# Patient Record
Sex: Female | Born: 1955 | Race: White | Hispanic: No | Smoking: Never smoker
Health system: Southern US, Community
[De-identification: ages and names within clinical notes are randomized; demographics above are authoritative.]

## PROBLEM LIST (undated history)

## (undated) DIAGNOSIS — I10 Essential (primary) hypertension: Secondary | ICD-10-CM

## (undated) DIAGNOSIS — E785 Hyperlipidemia, unspecified: Secondary | ICD-10-CM

## (undated) DIAGNOSIS — E079 Disorder of thyroid, unspecified: Secondary | ICD-10-CM

## (undated) HISTORY — DX: Disorder of thyroid, unspecified: E07.9

## (undated) HISTORY — DX: Hyperlipidemia, unspecified: E78.5

## (undated) HISTORY — DX: Essential (primary) hypertension: I10

---

## 1994-10-20 HISTORY — PX: CHOLECYSTECTOMY: SHX55

## 2012-01-29 ENCOUNTER — Other Ambulatory Visit (HOSPITAL_COMMUNITY)
Admission: RE | Admit: 2012-01-29 | Discharge: 2012-01-29 | Disposition: A | Discharge status: Home / Self Care | Payer: BC Managed Care – PPO | Source: Ambulatory Visit | Attending: Obstetrics and Gynecology | Admitting: Obstetrics and Gynecology

## 2012-01-29 DIAGNOSIS — Z124 Encounter for screening for malignant neoplasm of cervix: Secondary | ICD-10-CM | POA: Insufficient documentation

## 2014-08-07 LAB — HM MAMMOGRAPHY

## 2014-08-20 HISTORY — PX: EYELID LACERATION REPAIR: SHX1564

## 2014-09-19 ENCOUNTER — Encounter: Payer: Self-pay | Admitting: Internal Medicine

## 2014-09-19 ENCOUNTER — Encounter: Payer: Self-pay | Admitting: *Deleted

## 2014-09-19 ENCOUNTER — Ambulatory Visit (INDEPENDENT_AMBULATORY_CARE_PROVIDER_SITE_OTHER): Payer: PRIVATE HEALTH INSURANCE | Admitting: Internal Medicine

## 2014-09-19 VITALS — BP 130/88 | HR 70 | Temp 98.0°F | Resp 10 | Ht 64.5 in | Wt 232.0 lb

## 2014-09-19 DIAGNOSIS — G4700 Insomnia, unspecified: Secondary | ICD-10-CM

## 2014-09-19 DIAGNOSIS — E890 Postprocedural hypothyroidism: Secondary | ICD-10-CM

## 2014-09-19 DIAGNOSIS — E785 Hyperlipidemia, unspecified: Secondary | ICD-10-CM | POA: Insufficient documentation

## 2014-09-19 DIAGNOSIS — E669 Obesity, unspecified: Secondary | ICD-10-CM

## 2014-09-19 NOTE — Progress Notes (Signed)
Patient ID: Kristen Moran, female   DOB: September 15, 1956, 58 y.o.   MRN: [id number]    Chief Complaint  Patient presents with  . Establish Care    New patient establish care, discuss diagnosis of high blood pressure, not on medications x 5 years   . Medical Management of Chronic Issues    Discuss starting Adderall    No Known Allergies  HPI 58 y/o female patient is here to establish care. She sees dr patel for endocrinology at [facility] She was seeing Dr Donata DuffPollick 5 years back as PCP She last saw her ObGyn 3 years back uptodate with mammogram in oct 2015 was normal Last pap smear 3 years back She mentions having rouble concentrating and completing her task for several years. Denies any anxiety or depression  Review of Systems  Constitutional: Negative for fever, chills, diaphoresis. low energy level HENT: Negative for congestion, hearing loss and sore throat.   Eyes: Negative for blurred vision, double vision and discharge. uptodate with eye exam Respiratory: Negative for cough, sputum production, shortness of breath and wheezing.   Cardiovascular: Negative for chest pain, palpitations, orthopnea and leg swelling.  Gastrointestinal: Negative for heartburn, nausea, vomiting, abdominal pain, diarrhea and constipation.  Genitourinary: Negative for dysuria, urgency, frequency and flank pain.  Musculoskeletal: Negative for back pain, falls, myalgias. has knee and ankle joint pain Skin: Negative for itching and rash.  Neurological: egative for dizziness, tingling, focal weakness and headaches.  Psychiatric/Behavioral: Negative for depression and memory loss. Has some anxiety at new work. [place] on business trip this week. Has trouble sleeping at night. Denies nocturia  Past Medical History  Diagnosis Date  . Hypertension   . Hyperlipidemia   . Thyroid disease     Hyperthyroidism   Past Surgical History  Procedure Laterality Date  . Cesarean section  Willy Eddy1990   Faraban MD  . Cesarean section  1993    Faraban MD  . Cholecystectomy  [id number]    Clent RidgesWalsh, MD  . Eyelid laceration repair  08/2014     Medication List       This list is accurate as of: 09/19/14 11:42 AM.  Always use your most recent med list.               cholecalciferol 1000 UNITS tablet  Commonly known as:  VITAMIN D  Take 1,000 Units by mouth daily.     FISH OIL BURP-LESS 1000 MG Caps  Take 1 capsule by mouth daily.     levothyroxine 150 MCG tablet  Commonly known as:  SYNTHROID, LEVOTHROID  1 by mouth daily x 6 days of the week, 1 1/2 on the 7th day for hypothyroidism     multivitamin with minerals tablet  Take 1 tablet by mouth daily.     neomycin-polymyxin b-dexamethasone 3.5-10000-0.1 Oint  Commonly known as:  MAXITROL  Apply 1 strip twice daily on the suture line of both eyelids     vitamin B-12 100 MCG tablet  Commonly known as:  CYANOCOBALAMIN  Take 100 mcg by mouth daily.       Family History  Problem Relation Age of Onset  . Cancer Mother     Breast  . Colitis Mother   . Osteoporosis Mother   . Diabetes Father     Type 2  . Cancer Maternal Grandmother     60  . Cancer Maternal Grandfather     1170   History   Social History  . Marital  Status: Married    Spouse Name: N/A    Number of Children: N/A  . Years of Education: N/A   Social History Main Topics  . Smoking status: Never Smoker   . Smokeless tobacco: Never Used  . Alcohol Use: 0.0 oz/week    0 Not specified per week     Comment: 1-2  . Drug Use: No  . Sexual Activity: None   Other Topics Concern  . None   Social History Narrative   Physical exam BP 130/88 mmHg  Pulse 70  Temp(Src) 98 F (36.7 C) (Oral)  Resp 10  Ht 5' 4.5" (1.638 m)  Wt 232 lb (105.235 kg)  BMI 39.22 kg/m2  SpO2 98%  General- adult female in no acute distress, obese Head- atraumatic, normocephalic Eyes- PERRLA, EOMI, no pallor, no icterus, no discharge Neck- no lymphadenopathy Mouth- normal mucus  membrane Cardiovascular- normal s1,s2, no murmurs, normal distal pulses Respiratory- bilateral clear to auscultation, no wheeze, no rhonchi, no crackles Abdomen- bowel sounds present, soft, non tender Musculoskeletal- able to move all 4 extremities, steady gait, no use of assistive device, normal range of motion, no leg edema Neurological- no focal deficit Skin- warm and dry Psychiatry- alert and oriented to person, place and time, normal mood and affect  Labs- none to review  Assessment/plan  1. Postoperative hypothyroidism continue levothyroxine, check thyroid panel - CMP - Lipid Panel - CBC with Differential - TSH  2. Hyperlipidemia Check lipid panel, currently off all meds - CMP - Lipid Panel  3. Obesity counselling on diet and exercise. Assess for metabolic syndrome - CMP - CBC with Differential - Hemoglobin A1c  4. Insomnia Advised on sleep hygiene and to take melatonin 3 mg qhs for now  Advised on influenza vaccine and pt would like to take it after her trip

## 2014-09-20 LAB — LIPID PANEL
CHOL/HDL RATIO: 2.3 ratio (ref 0.0–4.4)
Cholesterol, Total: 203 mg/dL — ABNORMAL HIGH (ref 100–199)
HDL: 88 mg/dL (ref >39)
LDL CALC: 93 mg/dL (ref 0–99)
TRIGLYCERIDES: 109 mg/dL (ref 0–149)
VLDL Cholesterol Cal: 22 mg/dL (ref 5–40)

## 2014-09-20 LAB — CBC WITH DIFFERENTIAL/PLATELET
Basophils Absolute: 0 10*3/uL (ref 0.0–0.2)
Basos: 1 %
EOS: 1 %
Eosinophils Absolute: 0.1 10*3/uL (ref 0.0–0.4)
HEMATOCRIT: 37.7 % (ref 34.0–46.6)
HEMOGLOBIN: 12.3 g/dL (ref 11.1–15.9)
Immature Grans (Abs): 0 10*3/uL (ref 0.0–0.1)
Immature Granulocytes: 0 %
LYMPHS: 36 %
Lymphocytes Absolute: 2.3 10*3/uL (ref 0.7–3.1)
MCH: 28.9 pg (ref 26.6–33.0)
MCHC: 32.6 g/dL (ref 31.5–35.7)
MCV: 89 fL (ref 79–97)
MONOCYTES: 8 %
Monocytes Absolute: 0.5 10*3/uL (ref 0.1–0.9)
NEUTROS ABS: 3.4 10*3/uL (ref 1.4–7.0)
Neutrophils Relative %: 54 %
RBC: 4.26 x10E6/uL (ref 3.77–5.28)
RDW: 13.6 % (ref 12.3–15.4)
WBC: 6.3 10*3/uL (ref 3.4–10.8)

## 2014-09-20 LAB — COMPREHENSIVE METABOLIC PANEL
A/G RATIO: 1.9 (ref 1.1–2.5)
ALBUMIN: 4.2 g/dL (ref 3.5–5.5)
ALT: 21 IU/L (ref 0–32)
AST: 22 IU/L (ref 0–40)
Alkaline Phosphatase: 81 IU/L (ref 39–117)
BILIRUBIN TOTAL: 0.4 mg/dL (ref 0.0–1.2)
BUN/Creatinine Ratio: 19 (ref 9–23)
BUN: 17 mg/dL (ref 6–24)
CO2: 24 mmol/L (ref 18–29)
CREATININE: 0.91 mg/dL (ref 0.57–1.00)
Calcium: 9.5 mg/dL (ref 8.7–10.2)
Chloride: 101 mmol/L (ref 97–108)
GFR, EST AFRICAN AMERICAN: 81 mL/min/{1.73_m2} (ref >59)
GFR, EST NON AFRICAN AMERICAN: 70 mL/min/{1.73_m2} (ref >59)
Globulin, Total: 2.2 g/dL (ref 1.5–4.5)
Glucose: 96 mg/dL (ref 65–99)
Potassium: 4.4 mmol/L (ref 3.5–5.2)
SODIUM: 140 mmol/L (ref 134–144)
Total Protein: 6.4 g/dL (ref 6.0–8.5)

## 2014-09-20 LAB — HEMOGLOBIN A1C
Est. average glucose Bld gHb Est-mCnc: 120 mg/dL
HEMOGLOBIN A1C: 5.8 % — AB (ref 4.8–5.6)

## 2014-09-20 LAB — TSH: TSH: 2.24 u[IU]/mL (ref 0.450–4.500)

## 2014-10-26 DIAGNOSIS — Z4789 Encounter for other orthopedic aftercare: Secondary | ICD-10-CM | POA: Insufficient documentation

## 2014-11-21 ENCOUNTER — Encounter: Payer: Self-pay | Admitting: Internal Medicine

## 2014-11-21 ENCOUNTER — Ambulatory Visit (INDEPENDENT_AMBULATORY_CARE_PROVIDER_SITE_OTHER): Payer: PRIVATE HEALTH INSURANCE | Admitting: Internal Medicine

## 2014-11-21 VITALS — BP 136/80 | HR 67 | Temp 97.9°F | Resp 20 | Ht 65.0 in | Wt 226.0 lb

## 2014-11-21 DIAGNOSIS — E669 Obesity, unspecified: Secondary | ICD-10-CM

## 2014-11-21 DIAGNOSIS — R7303 Prediabetes: Secondary | ICD-10-CM

## 2014-11-21 DIAGNOSIS — E890 Postprocedural hypothyroidism: Secondary | ICD-10-CM

## 2014-11-21 DIAGNOSIS — Z01419 Encounter for gynecological examination (general) (routine) without abnormal findings: Secondary | ICD-10-CM

## 2014-11-21 DIAGNOSIS — Z0000 Encounter for general adult medical examination without abnormal findings: Secondary | ICD-10-CM

## 2014-11-21 DIAGNOSIS — G4700 Insomnia, unspecified: Secondary | ICD-10-CM

## 2014-11-21 DIAGNOSIS — E785 Hyperlipidemia, unspecified: Secondary | ICD-10-CM

## 2014-11-21 DIAGNOSIS — R7309 Other abnormal glucose: Secondary | ICD-10-CM
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
## 2014-11-25 LAB — PAP LB (LIQUID-BASED): PAP Smear Comment: 0

## 2014-11-27 ENCOUNTER — Encounter: Payer: Self-pay | Admitting: *Deleted

## 2014-11-28 ENCOUNTER — Encounter: Payer: Self-pay | Admitting: Internal Medicine

## 2014-11-28 DIAGNOSIS — IMO0002 Reserved for concepts with insufficient information to code with codable children: Secondary | ICD-10-CM | POA: Insufficient documentation

## 2014-11-28 DIAGNOSIS — S63409A Traumatic rupture of unspecified ligament of unspecified finger at metacarpophalangeal and interphalangeal joint, initial encounter: Secondary | ICD-10-CM | POA: Insufficient documentation

## 2014-12-12 ENCOUNTER — Encounter: Payer: Self-pay | Admitting: Internal Medicine

## 2014-12-18 ENCOUNTER — Other Ambulatory Visit: Payer: Self-pay | Admitting: Internal Medicine

## 2014-12-18 DIAGNOSIS — E669 Obesity, unspecified: Secondary | ICD-10-CM

## 2014-12-18 DIAGNOSIS — Z780 Asymptomatic menopausal state: Secondary | ICD-10-CM

## 2015-02-08 DIAGNOSIS — M79641 Pain in right hand: Secondary | ICD-10-CM | POA: Insufficient documentation

## 2015-05-07 ENCOUNTER — Encounter: Payer: Self-pay | Admitting: Nurse Practitioner

## 2015-05-19 ENCOUNTER — Encounter: Payer: Self-pay | Admitting: Cardiology

## 2015-08-09 LAB — HM MAMMOGRAPHY

## 2015-10-29 ENCOUNTER — Encounter: Payer: Self-pay | Admitting: Nurse Practitioner

## 2015-11-22 ENCOUNTER — Other Ambulatory Visit: Payer: PRIVATE HEALTH INSURANCE

## 2015-11-27 ENCOUNTER — Encounter: Payer: PRIVATE HEALTH INSURANCE | Admitting: Internal Medicine

## 2015-11-27 ENCOUNTER — Other Ambulatory Visit: Payer: PRIVATE HEALTH INSURANCE

## 2015-11-28 ENCOUNTER — Other Ambulatory Visit: Payer: PRIVATE HEALTH INSURANCE

## 2015-11-28 DIAGNOSIS — R7303 Prediabetes: Secondary | ICD-10-CM

## 2015-11-28 DIAGNOSIS — E669 Obesity, unspecified: Secondary | ICD-10-CM

## 2015-11-28 DIAGNOSIS — E785 Hyperlipidemia, unspecified: Secondary | ICD-10-CM

## 2015-11-29 ENCOUNTER — Ambulatory Visit (INDEPENDENT_AMBULATORY_CARE_PROVIDER_SITE_OTHER): Payer: PRIVATE HEALTH INSURANCE | Admitting: Nurse Practitioner

## 2015-11-29 ENCOUNTER — Encounter: Payer: Self-pay | Admitting: Nurse Practitioner

## 2015-11-29 VITALS — BP 144/80 | HR 67 | Temp 98.2°F | Ht 63.0 in | Wt 228.1 lb

## 2015-11-29 DIAGNOSIS — E559 Vitamin D deficiency, unspecified: Secondary | ICD-10-CM

## 2015-11-29 DIAGNOSIS — Z0000 Encounter for general adult medical examination without abnormal findings: Secondary | ICD-10-CM

## 2015-11-29 DIAGNOSIS — Z1211 Encounter for screening for malignant neoplasm of colon: Secondary | ICD-10-CM

## 2015-11-29 DIAGNOSIS — F32A Depression, unspecified: Secondary | ICD-10-CM

## 2015-11-29 DIAGNOSIS — F329 Major depressive disorder, single episode, unspecified: Secondary | ICD-10-CM

## 2015-11-29 LAB — CBC WITH DIFFERENTIAL/PLATELET
BASOS ABS: 0.1 10*3/uL (ref 0.0–0.2)
Basos: 1 %
EOS (ABSOLUTE): 0.1 10*3/uL (ref 0.0–0.4)
Eos: 2 %
HEMOGLOBIN: 13.1 g/dL (ref 11.1–15.9)
Hematocrit: 39.3 % (ref 34.0–46.6)
Immature Grans (Abs): 0 10*3/uL (ref 0.0–0.1)
Immature Granulocytes: 0 %
LYMPHS ABS: 2.8 10*3/uL (ref 0.7–3.1)
Lymphs: 44 %
MCH: 29.2 pg (ref 26.6–33.0)
MCHC: 33.3 g/dL (ref 31.5–35.7)
MCV: 88 fL (ref 79–97)
MONOS ABS: 0.4 10*3/uL (ref 0.1–0.9)
Monocytes: 7 %
NEUTROS ABS: 2.9 10*3/uL (ref 1.4–7.0)
Neutrophils: 46 %
Platelets: 211 10*3/uL (ref 150–379)
RBC: 4.49 x10E6/uL (ref 3.77–5.28)
RDW: 13.4 % (ref 12.3–15.4)
WBC: 6.3 10*3/uL (ref 3.4–10.8)

## 2015-11-29 LAB — HEMOGLOBIN A1C
Est. average glucose Bld gHb Est-mCnc: 117 mg/dL
HEMOGLOBIN A1C: 5.7 % — AB (ref 4.8–5.6)

## 2015-11-29 LAB — VITAMIN D 25 HYDROXY (VIT D DEFICIENCY, FRACTURES): Vit D, 25-Hydroxy: 21.9 ng/mL — ABNORMAL LOW (ref 30.0–100.0)

## 2015-11-29 LAB — COMPREHENSIVE METABOLIC PANEL
ALBUMIN: 3.9 g/dL (ref 3.5–5.5)
ALK PHOS: 82 IU/L (ref 39–117)
ALT: 24 IU/L (ref 0–32)
AST: 22 IU/L (ref 0–40)
Albumin/Globulin Ratio: 1.7 (ref 1.1–2.5)
BUN/Creatinine Ratio: 16 (ref 9–23)
BUN: 14 mg/dL (ref 6–24)
Bilirubin Total: 0.5 mg/dL (ref 0.0–1.2)
CO2: 25 mmol/L (ref 18–29)
Calcium: 9.8 mg/dL (ref 8.7–10.2)
Chloride: 100 mmol/L (ref 96–106)
Creatinine, Ser: 0.9 mg/dL (ref 0.57–1.00)
GFR, EST AFRICAN AMERICAN: 82 mL/min/{1.73_m2} (ref >59)
GFR, EST NON AFRICAN AMERICAN: 71 mL/min/{1.73_m2} (ref >59)
Globulin, Total: 2.3 g/dL (ref 1.5–4.5)
Glucose: 91 mg/dL (ref 65–99)
POTASSIUM: 4.3 mmol/L (ref 3.5–5.2)
Sodium: 138 mmol/L (ref 134–144)
Total Protein: 6.2 g/dL (ref 6.0–8.5)

## 2015-11-29 LAB — LIPID PANEL
CHOL/HDL RATIO: 2.5 ratio (ref 0.0–4.4)
Cholesterol, Total: 202 mg/dL — ABNORMAL HIGH (ref 100–199)
HDL: 82 mg/dL (ref >39)
LDL CALC: 93 mg/dL (ref 0–99)
TRIGLYCERIDES: 135 mg/dL (ref 0–149)
VLDL CHOLESTEROL CAL: 27 mg/dL (ref 5–40)

## 2015-11-29 LAB — TSH: TSH: 0.728 u[IU]/mL (ref 0.450–4.500)

## 2015-11-29 MED ORDER — VITAMIN D (ERGOCALCIFEROL) 1.25 MG (50000 UNIT) PO CAPS: 50000.0000 [IU] | ORAL_CAPSULE | ORAL | Status: DC

## 2015-11-29 MED ORDER — CITALOPRAM HYDROBROMIDE 20 MG PO TABS: 20.0000 mg | ORAL_TABLET | Freq: Every day | ORAL | Status: DC

## 2015-11-29 NOTE — Progress Notes (Signed)
Patient ID: Kristen Moran, female   DOB: [birth year], 60 y.o.   MRN: [id number]    PCP: Sharon Seller, NP  Advanced Directive information Does patient have an advance directive?: No, Would patient like information on creating an advanced directive?: Yes - Educational materials given  No Known Allergies  Chief Complaint  Patient presents with  . Annual Exam    Pt. here for yearly check up/EKG/discuss labs (copy/printed)./no pap  . Depression screening    + depression (17)  . Advance directive    Discuss HPOA/Living Will   . Orders    BMD and colonoscopy      HPI: Patient is a 59 y.o. female seen in the office today for annual exam.  No acute issues in the last year. No major illness or hospitalization.  Screenings: Colon Cancer- Dr Bascom Levels in [place] ~5 years ago, needs to follow up.  Breast Cancer- last mammogram Oct 2016- normal, through [facility], had question that suggested further testing due to pts family members with hx of breath cancer.  Cervical Cancer- 2016-normal Depression screening Depression screen [facility] 2/9 11/29/2015 11/21/2014 11/21/2014 09/19/2014  Decreased Interest 2 0 0 0  Down, Depressed, Hopeless 1 0 0 0  PHQ - 2 Score 3 0 0 0  Altered sleeping 3 - - -  Tired, decreased energy 1 - - -  Change in appetite 3 - - -  Feeling bad or failure about yourself  3 - - -  Trouble concentrating 3 - - -  Moving slowly or fidgety/restless 0 - - -  Suicidal thoughts 1 - - -  PHQ-9 Score 17 - - -  thoughts that she may be better off dead but never has actually thought of hurting herself, and reports she would not hurt herself or other. Increased stress, her mother is getting older, kids out of the house, marriage is not great Has not had any therapy. Not opposed to medication to help mood  Falls Fall Risk  11/29/2015 11/21/2014 11/21/2014 09/19/2014  Falls in the past year? Yes No No No  Number falls in past yr: 1 - - -  Injury with Fall? No - - -  tripped when walking dog.    Vaccines Immunization History  Administered Date(s) Administered  . Tdap 10/20/2014  does not get influenza vaccine.   Smoking status: never smoke Alcohol use: occasionally  Dentist: every 6 months Ophthalmologist: yearly  Exercise regimen: walk dogs twice daily. Total of 40 mins a day- slow paced.  Diet: none  Review of Systems:  Review of Systems  Constitutional: Negative for activity change, appetite change, fatigue and unexpected weight change.  HENT: Negative for congestion and hearing loss.   Eyes: Negative.   Respiratory: Negative for cough and shortness of breath.   Cardiovascular: Negative for chest pain, palpitations and leg swelling.  Gastrointestinal: Negative for abdominal pain, diarrhea and constipation.  Genitourinary: Negative for dysuria and difficulty urinating.  Musculoskeletal: Negative for myalgias and arthralgias.  Skin: Negative for color change and wound.  Neurological: Negative for dizziness and weakness.  Psychiatric/Behavioral: Negative for behavioral problems, confusion and agitation.       Depression, increase stress    Past Medical History  Diagnosis Date  . Hypertension   . Hyperlipidemia   . Thyroid disease     Hyperthyroidism   Past Surgical History  Procedure Laterality Date  . Cesarean section  Willy Eddy MD  . Cesarean section  [phone]  Antony Blackbird MD  . Cholecystectomy  [id number]    Clent Ridges, MD  . Eyelid laceration repair  08/2014   Social History:   reports that she has never smoked. She has never used smokeless tobacco. She reports that she drinks alcohol. She reports that she does not use illicit drugs.  Family History  Problem Relation Age of Onset  . Cancer Mother     Breast  . Colitis Mother   . Osteoporosis Mother   . Diabetes Father     Type 2  . Cancer Maternal Grandmother     60  . Cancer Maternal Grandfather     36    Medications: Patient's Medications  New Prescriptions   No medications on file   Previous Medications   CHOLECALCIFEROL (VITAMIN D) 1000 UNITS TABLET    Take 1,000 Units by mouth daily.   LEVOTHYROXINE (SYNTHROID, LEVOTHROID) 150 MCG TABLET    1 by mouth daily x 6 days of the week, 1 1/2 on the 7th day for hypothyroidism   OMEGA-3 FATTY ACIDS (FISH OIL BURP-LESS) 1000 MG CAPS    Take 1 capsule by mouth daily.   VITAMIN B-12 (CYANOCOBALAMIN) 100 MCG TABLET    Take 100 mcg by mouth daily.  Modified Medications   No medications on file  Discontinued Medications   NEOMYCIN-POLYMYXIN B-DEXAMETHASONE (MAXITROL) 3.5-10000-0.1 OINT    Reported on 11/29/2015     Physical Exam:  Filed Vitals:   11/29/15 1042  BP: 144/80  Pulse: 67  Temp: 98.2 F (36.8 C)  TempSrc: Oral  Height:  (1.6 m)  Weight: 228 lb 2 oz (103.477 kg)  SpO2: 99%   Body mass index is 40.42 kg/(m^2).  Physical Exam  Constitutional: She is oriented to person, place, and time. She appears well-developed and well-nourished. No distress.  HENT:  Head: Normocephalic and atraumatic.  Mouth/Throat: Oropharynx is clear and moist. No oropharyngeal exudate.  Eyes: Conjunctivae are normal. Pupils are equal, round, and reactive to light.  Neck: Normal range of motion. Neck supple.  Cardiovascular: Normal rate, regular rhythm and normal heart sounds.   Pulmonary/Chest: Effort normal and breath sounds normal.  Abdominal: Soft. Bowel sounds are normal.  Musculoskeletal: She exhibits no edema or tenderness.  Neurological: She is alert and oriented to person, place, and time.  Skin: Skin is warm and dry. She is not diaphoretic.  Psychiatric: She has a normal mood and affect.    Labs reviewed: Basic Metabolic Panel:  Recent Labs  96/04/54 0959  NA 138  K 4.3  CL 100  CO2 25  GLUCOSE 91  BUN 14  CREATININE 0.90  CALCIUM 9.8  TSH 0.728   Liver Function Tests:  Recent Labs  11/28/15 0959  AST 22  ALT 24  ALKPHOS 82  BILITOT 0.5  PROT 6.2  ALBUMIN 3.9   No results for input(s): LIPASE,  AMYLASE in the last 8760 hours. No results for input(s): AMMONIA in the last 8760 hours. CBC:  Recent Labs  11/28/15 0959  WBC 6.3  NEUTROABS 2.9  HCT 39.3  MCV 88  PLT 211   Lipid Panel:  Recent Labs  11/28/15 0959  CHOL 202*  HDL 82  LDLCALC 93  TRIG 135  CHOLHDL 2.5   TSH:  Recent Labs  11/28/15 0959  TSH 0.728   A1C: Lab Results  Component Value Date   HGBA1C 5.7* 11/28/2015     Assessment/Plan 1. Wellness examination The patient is doing well and no distinct problems were identified  on exam. The patient was counseled regarding the appropriate use of alcohol, regular self-examination of the breasts on a monthly basis, prevention of dental and periodontal disease, diet, regular sustained exercise for at least 30 minutes 5 times per week, outine screening interval for mammogram as recommended by the American Cancer Society and ACOG, importance of regular PAP smears, and recommended schedule for GI hemoccult testing, colonoscopy, cholesterol, thyroid and diabetes screening. -to call GI in regards to next colonoscopy -to have mammogram sent to office -labs reviewed with pt. - EKG 12-Lead- normal EKG  2. Special screening for malignant neoplasms, colon -will call GI office, may not be due for colonoscopy or need referral. If referral is needed will contact us.   3. Vitamin D deficiency -pt not consistently taking Vit D supplement  - Vitamin D, Ergocalciferol, (DRISDOL) 50000 units CAPS capsule; Take 1 capsule (50,000 Units total) by mouth every 7 (seven) days.  Dispense: 12 capsule; Refill: 0 for 12 weeks then to resume Vit D 1000 units daily  4. Depression -worsening mood due to stressors she is unable to manage at this time. Discussed psychologist/therapy along with medication - citalopram (CELEXA) 20 MG tablet; Take 1 tablet (20 mg total) by mouth daily.  Dispense: 30 tablet; Refill: 3 -discussed to notify immediately for any adverse effects when starting  medication. Feeling of SI or HI.   Follow up in 4 weeks on depression   Zaevion Parke K. Biagio Borg  [facility] [phone] 8 am - 5 pm) [phone] (after hours)

## 2015-11-29 NOTE — Patient Instructions (Signed)
Start Vitamin D 50,000 units once weekly by mouth for 12 weeks then resume vitamin 1000 units daily  Start celexa 20 mg daily- also recommend counseling/thearpy  Follow up in 4 weeks   To follow up with your gastroenterologist to see if it is time for colonoscopy Please have mammogram records sent     Health Maintenance, Female Adopting a healthy lifestyle and getting preventive care can go a long way to promote health and wellness. Talk with your health care provider about what schedule of regular examinations is right for you. This is a good chance for you to check in with your provider about disease prevention and staying healthy. In between checkups, there are plenty of things you can do on your own. Experts have done a lot of research about which lifestyle changes and preventive measures are most likely to keep you healthy. Ask your health care provider for more information. WEIGHT AND DIET  Eat a healthy diet  Be sure to include plenty of vegetables, fruits, low-fat dairy products, and lean protein.  Do not eat a lot of foods high in solid fats, added sugars, or salt.  Get regular exercise. This is one of the most important things you can do for your health.  Most adults should exercise for at least 150 minutes each week. The exercise should increase your heart rate and make you sweat (moderate-intensity exercise).  Most adults should also do strengthening exercises at least twice a week. This is in addition to the moderate-intensity exercise.  Maintain a healthy weight  Body mass index (BMI) is a measurement that can be used to identify possible weight problems. It estimates body fat based on height and weight. Your health care provider can help determine your BMI and help you achieve or maintain a healthy weight.  For females 28 years of age and older:   A BMI below 18.5 is considered underweight.  A BMI of 18.5 to 24.9 is normal.  A BMI of 25 to 29.9 is considered  overweight.  A BMI of 30 and above is considered obese.  Watch levels of cholesterol and blood lipids  You should start having your blood tested for lipids and cholesterol at 59 years of age, then have this test every 5 years.  You may need to have your cholesterol levels checked more often if:  Your lipid or cholesterol levels are high.  You are older than 59 years of age.  You are at high risk for heart disease.  CANCER SCREENING   Lung Cancer  Lung cancer screening is recommended for adults 16-69 years old who are at high risk for lung cancer because of a history of smoking.  A yearly low-dose CT scan of the lungs is recommended for people who:  Currently smoke.  Have quit within the past 15 years.  Have at least a 30-pack-year history of smoking. A pack year is smoking an average of one pack of cigarettes a day for 1 year.  Yearly screening should continue until it has been 15 years since you quit.  Yearly screening should stop if you develop a health problem that would prevent you from having lung cancer treatment.  Breast Cancer  Practice breast self-awareness. This means understanding how your breasts normally appear and feel.  It also means doing regular breast self-exams. Let your health care provider know about any changes, no matter how small.  If you are in your 20s or 30s, you should have a clinical breast exam (CBE)  by a health care provider every 1-3 years as part of a regular health exam.  If you are 38 or older, have a CBE every year. Also consider having a breast X-ray (mammogram) every year.  If you have a family history of breast cancer, talk to your health care provider about genetic screening.  If you are at high risk for breast cancer, talk to your health care provider about having an MRI and a mammogram every year.  Breast cancer gene (BRCA) assessment is recommended for women who have family members with BRCA-related cancers. BRCA-related  cancers include:  Breast.  Ovarian.  Tubal.  Peritoneal cancers.  Results of the assessment will determine the need for genetic counseling and BRCA1 and BRCA2 testing. Cervical Cancer Your health care provider may recommend that you be screened regularly for cancer of the pelvic organs (ovaries, uterus, and vagina). This screening involves a pelvic examination, including checking for microscopic changes to the surface of your cervix (Pap test). You may be encouraged to have this screening done every 3 years, beginning at age 30.  For women ages 54-65, health care providers may recommend pelvic exams and Pap testing every 3 years, or they may recommend the Pap and pelvic exam, combined with testing for human papilloma virus (HPV), every 5 years. Some types of HPV increase your risk of cervical cancer. Testing for HPV may also be done on women of any age with unclear Pap test results.  Other health care providers may not recommend any screening for nonpregnant women who are considered low risk for pelvic cancer and who do not have symptoms. Ask your health care provider if a screening pelvic exam is right for you.  If you have had past treatment for cervical cancer or a condition that could lead to cancer, you need Pap tests and screening for cancer for at least 20 years after your treatment. If Pap tests have been discontinued, your risk factors (such as having a new sexual partner) need to be reassessed to determine if screening should resume. Some women have medical problems that increase the chance of getting cervical cancer. In these cases, your health care provider may recommend more frequent screening and Pap tests. Colorectal Cancer  This type of cancer can be detected and often prevented.  Routine colorectal cancer screening usually begins at 59 years of age and continues through 59 years of age.  Your health care provider may recommend screening at an earlier age if you have risk  factors for colon cancer.  Your health care provider may also recommend using home test kits to check for hidden blood in the stool.  A small camera at the end of a tube can be used to examine your colon directly (sigmoidoscopy or colonoscopy). This is done to check for the earliest forms of colorectal cancer.  Routine screening usually begins at age 72.  Direct examination of the colon should be repeated every 5-10 years through 59 years of age. However, you may need to be screened more often if early forms of precancerous polyps or small growths are found. Skin Cancer  Check your skin from head to toe regularly.  Tell your health care provider about any new moles or changes in moles, especially if there is a change in a mole's shape or color.  Also tell your health care provider if you have a mole that is larger than the size of a pencil eraser.  Always use sunscreen. Apply sunscreen liberally and repeatedly throughout the  day.  Protect yourself by wearing long sleeves, pants, a wide-brimmed hat, and sunglasses whenever you are outside. HEART DISEASE, DIABETES, AND HIGH BLOOD PRESSURE   High blood pressure causes heart disease and increases the risk of stroke. High blood pressure is more likely to develop in:  People who have blood pressure in the high end of the normal range (130-139/85-89 mm Hg).  People who are overweight or obese.  People who are African American.  If you are 51-35 years of age, have your blood pressure checked every 3-5 years. If you are 59 years of age or older, have your blood pressure checked every year. You should have your blood pressure measured twice--once when you are at a hospital or clinic, and once when you are not at a hospital or clinic. Record the average of the two measurements. To check your blood pressure when you are not at a hospital or clinic, you can use:  An automated blood pressure machine at a pharmacy.  A home blood pressure  monitor.  If you are between 82 years and 40 years old, ask your health care provider if you should take aspirin to prevent strokes.  Have regular diabetes screenings. This involves taking a blood sample to check your fasting blood sugar level.  If you are at a normal weight and have a low risk for diabetes, have this test once every three years after 59 years of age.  If you are overweight and have a high risk for diabetes, consider being tested at a younger age or more often. PREVENTING INFECTION  Hepatitis B  If you have a higher risk for hepatitis B, you should be screened for this virus. You are considered at high risk for hepatitis B if:  You were born in a country where hepatitis B is common. Ask your health care provider which countries are considered high risk.  Your parents were born in a high-risk country, and you have not been immunized against hepatitis B (hepatitis B vaccine).  You have HIV or AIDS.  You use needles to inject street drugs.  You live with someone who has hepatitis B.  You have had sex with someone who has hepatitis B.  You get hemodialysis treatment.  You take certain medicines for conditions, including cancer, organ transplantation, and autoimmune conditions. Hepatitis C  Blood testing is recommended for:  Everyone born from [birth year] through 1964.  Anyone with known risk factors for hepatitis C. Sexually transmitted infections (STIs)  You should be screened for sexually transmitted infections (STIs) including gonorrhea and chlamydia if:  You are sexually active and are younger than 59 years of age.  You are older than 59 years of age and your health care provider tells you that you are at risk for this type of infection.  Your sexual activity has changed since you were last screened and you are at an increased risk for chlamydia or gonorrhea. Ask your health care provider if you are at risk.  If you do not have HIV, but are at risk, it may be  recommended that you take a prescription medicine daily to prevent HIV infection. This is called pre-exposure prophylaxis (PrEP). You are considered at risk if:  You are sexually active and do not regularly use condoms or know the HIV status of your partner(s).  You take drugs by injection.  You are sexually active with a partner who has HIV. Talk with your health care provider about whether you are at high risk of  being infected with HIV. If you choose to begin PrEP, you should first be tested for HIV. You should then be tested every 3 months for as long as you are taking PrEP.  PREGNANCY   If you are premenopausal and you may become pregnant, ask your health care provider about preconception counseling.  If you may become pregnant, take 400 to 800 micrograms (mcg) of folic acid every day.  If you want to prevent pregnancy, talk to your health care provider about birth control (contraception). OSTEOPOROSIS AND MENOPAUSE   Osteoporosis is a disease in which the bones lose minerals and strength with aging. This can result in serious bone fractures. Your risk for osteoporosis can be identified using a bone density scan.  If you are 73 years of age or older, or if you are at risk for osteoporosis and fractures, ask your health care provider if you should be screened.  Ask your health care provider whether you should take a calcium or vitamin D supplement to lower your risk for osteoporosis.  Menopause may have certain physical symptoms and risks.  Hormone replacement therapy may reduce some of these symptoms and risks. Talk to your health care provider about whether hormone replacement therapy is right for you.  HOME CARE INSTRUCTIONS   Schedule regular health, dental, and eye exams.  Stay current with your immunizations.   Do not use any tobacco products including cigarettes, chewing tobacco, or electronic cigarettes.  If you are pregnant, do not drink alcohol.  If you are  breastfeeding, limit how much and how often you drink alcohol.  Limit alcohol intake to no more than 1 drink per day for nonpregnant women. One drink equals 12 ounces of beer, 5 ounces of wine, or 1 ounces of hard liquor.  Do not use street drugs.  Do not share needles.  Ask your health care provider for help if you need support or information about quitting drugs.  Tell your health care provider if you often feel depressed.  Tell your health care provider if you have ever been abused or do not feel safe at home.   This information is not intended to replace advice given to you by your health care provider. Make sure you discuss any questions you have with your health care provider.   Document Released: 04/21/2011 Document Revised: 10/27/2014 Document Reviewed: 09/07/2013 Elsevier Interactive Patient Education [employer].

## 2015-12-05 ENCOUNTER — Telehealth: Payer: Self-pay

## 2015-12-05 NOTE — Telephone Encounter (Signed)
Letter mailed to patient regarding overdue mammogram. Kristen Moran 

## 2015-12-18 ENCOUNTER — Encounter: Payer: Self-pay | Admitting: *Deleted

## 2015-12-20 ENCOUNTER — Ambulatory Visit (INDEPENDENT_AMBULATORY_CARE_PROVIDER_SITE_OTHER): Payer: PRIVATE HEALTH INSURANCE | Admitting: Nurse Practitioner

## 2015-12-20 ENCOUNTER — Encounter: Payer: Self-pay | Admitting: Nurse Practitioner

## 2015-12-20 VITALS — BP 142/80 | HR 62 | Temp 97.9°F | Resp 20 | Ht 63.0 in | Wt 231.0 lb

## 2015-12-20 DIAGNOSIS — F329 Major depressive disorder, single episode, unspecified: Secondary | ICD-10-CM | POA: Diagnosis not present

## 2015-12-20 DIAGNOSIS — E559 Vitamin D deficiency, unspecified: Secondary | ICD-10-CM | POA: Diagnosis not present

## 2015-12-20 DIAGNOSIS — F32A Depression, unspecified: Secondary | ICD-10-CM

## 2015-12-20 MED ORDER — CITALOPRAM HYDROBROMIDE 20 MG PO TABS: 20.0000 mg | ORAL_TABLET | Freq: Every day | ORAL | Status: DC

## 2015-12-20 NOTE — Progress Notes (Signed)
Patient ID: Neysha Criado, female   DOB: [birth year], 60 y.o.   MRN: [id number]    PCP: Sharon Seller, NP  Advanced Directive information Does patient have an advance directive?: No, Would patient like information on creating an advanced directive?: Yes - Educational materials given  No Known Allergies  Chief Complaint  Patient presents with  . Follow-up    depression     HPI: Patient is a 60 y.o. female seen in the office today for 3 week follow up on depression. Pt was started on celexa due to worsening mood, increase depression. Pt reports she is feeling much better since starting medication. A light has just come back on. No adverse effects noted.  Taking vit d routinely at this time.   Review of Systems:  Review of Systems  Psychiatric/Behavioral: Negative for behavioral problems, dysphoric mood and agitation.  All other systems reviewed and are negative.   Past Medical History  Diagnosis Date  . Hypertension   . Hyperlipidemia   . Thyroid disease     Hyperthyroidism   Past Surgical History  Procedure Laterality Date  . Cesarean section  Willy Eddy MD  . Cesarean section  Jake Bathe MD  . Cholecystectomy  0865    Clent Ridges, MD  . Eyelid laceration repair  08/2014   Social History:   reports that she has never smoked. She has never used smokeless tobacco. She reports that she drinks alcohol. She reports that she does not use illicit drugs.  Family History  Problem Relation Age of Onset  . Cancer Mother     Breast  . Colitis Mother   . Osteoporosis Mother   . Diabetes Father     Type 2  . Cancer Maternal Grandmother     60  . Cancer Maternal Grandfather     96    Medications: Patient's Medications  New Prescriptions   No medications on file  Previous Medications   CHOLECALCIFEROL (VITAMIN D) 1000 UNITS TABLET    Take 1,000 Units by mouth daily.   CITALOPRAM (CELEXA) 20 MG TABLET    Take 1 tablet (20 mg total) by mouth daily.   LEVOTHYROXINE (SYNTHROID, LEVOTHROID) 150 MCG TABLET    1 by mouth daily x 6 days of the week, 1 1/2 on the 7th day for hypothyroidism   OMEGA-3 FATTY ACIDS (FISH OIL BURP-LESS) 1000 MG CAPS    Take 1 capsule by mouth daily.   VITAMIN B-12 (CYANOCOBALAMIN) 100 MCG TABLET    Take 100 mcg by mouth daily.   VITAMIN D, ERGOCALCIFEROL, (DRISDOL) 50000 UNITS CAPS CAPSULE    Take 1 capsule (50,000 Units total) by mouth every 7 (seven) days.  Modified Medications   No medications on file  Discontinued Medications   No medications on file     Physical Exam:  Filed Vitals:   12/20/15 1439  BP: 142/80  Pulse: 62  Temp: 97.9 F (36.6 C)  TempSrc: Oral  Resp: 20  Height:  (1.6 m)  Weight: 231 lb (104.781 kg)  SpO2: 98%   Body mass index is 40.93 kg/(m^2).  Physical Exam  Constitutional: She is oriented to person, place, and time. She appears well-developed and well-nourished.  Cardiovascular: Normal rate, regular rhythm and normal heart sounds.   Pulmonary/Chest: Effort normal and breath sounds normal.  Neurological: She is alert and oriented to person, place, and time.  Skin: Skin is warm and dry.  Psychiatric: She has a normal  mood and affect. Her behavior is normal. Judgment and thought content normal.    Labs reviewed: Basic Metabolic Panel:  Recent Labs  21/30/86 0959  NA 138  K 4.3  CL 100  CO2 25  GLUCOSE 91  BUN 14  CREATININE 0.90  CALCIUM 9.8  TSH 0.728   Liver Function Tests:  Recent Labs  11/28/15 0959  AST 22  ALT 24  ALKPHOS 82  BILITOT 0.5  PROT 6.2  ALBUMIN 3.9   No results for input(s): LIPASE, AMYLASE in the last 8760 hours. No results for input(s): AMMONIA in the last 8760 hours. CBC:  Recent Labs  11/28/15 0959  WBC 6.3  NEUTROABS 2.9  HCT 39.3  MCV 88  PLT 211   Lipid Panel:  Recent Labs  11/28/15 0959  CHOL 202*  HDL 82  LDLCALC 93  TRIG 135  CHOLHDL 2.5   TSH:  Recent Labs  11/28/15 0959  TSH 0.728    A1C: Lab Results  Component Value Date   HGBA1C 5.7* 11/28/2015     Assessment/Plan 1. Depression -doing well on medication, no adverse effects noted.  -mood has improved -to cont celexa - citalopram (CELEXA) 20 MG tablet; Take 1 tablet (20 mg total) by mouth daily.  Dispense: 30 tablet; Refill: 3 - Basic metabolic panel; Future  2. Vitamin D deficiency -cont on Vit D 50,000 units weekly then to start vit d 1000 units daily - Vitamin D, 25-hydroxy; Future prior to next visit  Follow up in 6 months    Lynnette Pote K. Biagio Borg  [facility] [phone] 8 am - 5 pm) [phone] (after hours)

## 2016-04-16 ENCOUNTER — Other Ambulatory Visit: Payer: Self-pay | Admitting: Nurse Practitioner

## 2016-06-06 ENCOUNTER — Other Ambulatory Visit: Payer: Self-pay

## 2016-06-06 DIAGNOSIS — F32A Depression, unspecified: Secondary | ICD-10-CM

## 2016-06-06 DIAGNOSIS — F329 Major depressive disorder, single episode, unspecified: Secondary | ICD-10-CM

## 2016-06-06 DIAGNOSIS — E559 Vitamin D deficiency, unspecified: Secondary | ICD-10-CM

## 2016-06-20 ENCOUNTER — Other Ambulatory Visit: Payer: PRIVATE HEALTH INSURANCE

## 2016-06-24 ENCOUNTER — Other Ambulatory Visit: Payer: PRIVATE HEALTH INSURANCE

## 2016-06-26 ENCOUNTER — Ambulatory Visit: Payer: PRIVATE HEALTH INSURANCE | Admitting: Nurse Practitioner

## 2016-08-11 ENCOUNTER — Other Ambulatory Visit: Payer: Self-pay | Admitting: Nurse Practitioner

## 2016-08-11 DIAGNOSIS — E559 Vitamin D deficiency, unspecified: Secondary | ICD-10-CM

## 2016-10-31 ENCOUNTER — Other Ambulatory Visit: Payer: Self-pay | Admitting: Nurse Practitioner

## 2016-10-31 DIAGNOSIS — E559 Vitamin D deficiency, unspecified: Secondary | ICD-10-CM

## 2017-01-08 ENCOUNTER — Telehealth: Payer: Self-pay

## 2017-01-08 MED ORDER — CITALOPRAM HYDROBROMIDE 20 MG PO TABS: ORAL_TABLET | ORAL | 0 refills | Status: DC

## 2017-01-08 NOTE — Telephone Encounter (Signed)
RX sent

## 2017-01-08 NOTE — Telephone Encounter (Signed)
Patient last OV greater than 1 year, pending appointment 02/26/17   Patient is requesting a refill on Celexa, please advise

## 2017-01-08 NOTE — Telephone Encounter (Signed)
Okay to refill at this time however will need to keep follow up

## 2017-02-26 ENCOUNTER — Ambulatory Visit (INDEPENDENT_AMBULATORY_CARE_PROVIDER_SITE_OTHER): Payer: PRIVATE HEALTH INSURANCE | Admitting: Nurse Practitioner

## 2017-02-26 ENCOUNTER — Encounter: Payer: Self-pay | Admitting: Nurse Practitioner

## 2017-02-26 VITALS — BP 138/88 | HR 72 | Temp 97.8°F | Resp 18 | Ht 63.0 in | Wt 250.2 lb

## 2017-02-26 DIAGNOSIS — Z0000 Encounter for general adult medical examination without abnormal findings: Secondary | ICD-10-CM | POA: Diagnosis not present

## 2017-02-26 DIAGNOSIS — F329 Major depressive disorder, single episode, unspecified: Secondary | ICD-10-CM | POA: Diagnosis not present

## 2017-02-26 DIAGNOSIS — R7303 Prediabetes: Secondary | ICD-10-CM

## 2017-02-26 DIAGNOSIS — E890 Postprocedural hypothyroidism: Secondary | ICD-10-CM

## 2017-02-26 DIAGNOSIS — F32A Depression, unspecified: Secondary | ICD-10-CM

## 2017-02-26 DIAGNOSIS — Z1211 Encounter for screening for malignant neoplasm of colon: Secondary | ICD-10-CM

## 2017-02-26 DIAGNOSIS — E559 Vitamin D deficiency, unspecified: Secondary | ICD-10-CM

## 2017-02-26 MED ORDER — CITALOPRAM HYDROBROMIDE 20 MG PO TABS: 20.0000 mg | ORAL_TABLET | Freq: Every day | ORAL | 3 refills | Status: DC

## 2017-02-26 NOTE — Progress Notes (Addendum)
Provider: Lauree Chandler, NP  Patient Care Team: Lauree Chandler, NP as PCP - General (Nurse Practitioner) Amalia Greenhouse, MD as Referring Physician (Endocrinology)  Extended Emergency Contact Information Primary Emergency Contact: [facility] Address: [address], [place] [address] [place] Phone: [phone] Relation: Spouse Secondary Emergency Contact: Wilson,Carolyn  [place] Phone: [phone] Mobile Phone: [phone] Relation: None No Known Allergies Code Status: FULL Goals of Care: Advanced Directive information Advanced Directives 02/26/2017  Does Patient Have a Medical Advance Directive? No  Type of Advance Directive -  Does patient want to make changes to medical advance directive? -  Copy of [place] in Chart? -  Would patient like information on creating a medical advance directive? -     Chief Complaint  Patient presents with  . Medical Management of Chronic Issues    Pt is being seen for a wellness female exam.     HPI: Patient is a 61 y.o. female seen in today for an annual wellness exam.   No major illnesses or hospitalization in the last year   Depression screen [facility] 2/9 02/26/2017 11/29/2015 11/21/2014 11/21/2014 09/19/2014  Decreased Interest 3 2 0 0 0  Down, Depressed, Hopeless 3 1 0 0 0  PHQ - 2 Score 6 3 0 0 0  Altered sleeping 3 3 - - -  Tired, decreased energy 3 1 - - -  Change in appetite 3 3 - - -  Feeling bad or failure about yourself  3 3 - - -  Trouble concentrating 3 3 - - -  Moving slowly or fidgety/restless 3 0 - - -  Suicidal thoughts 0 1 - - -  PHQ-9 Score 24 17 - - -  Difficult doing work/chores Extremely dIfficult - - - -    Fall Risk  02/26/2017 12/20/2015 11/29/2015 11/21/2014 11/21/2014  Falls in the past year? Yes No Yes No No  Number falls in past yr: 2 or more - 1 - -  Injury with Fall? Yes - No - -   No flowsheet data found.  having a  hard time with depression- mother has been really sick and had a bad fall. Husband left her. she is very tearful during exam. No SI or HI, had a friend who recently committed suicide and tearful over this. Using Citalopram but does not take it every day.   Health Maintenance  Topic Date Due  . Hepatitis C Screening  1956-06-21  . HIV Screening  07/06/1972  . INFLUENZA VACCINE  05/20/2017  . MAMMOGRAM  08/08/2017  . PAP SMEAR  11/21/2017  . COLONOSCOPY  10/20/2020  . TETANUS/TDAP  10/20/2024   Exercise? Walk 3-4 days week and yoga 1-2 times a week Diet?none, stress eating.   Visual Acuity Screening   Right eye Left eye Both eyes  Without correction: '20/20 20/20 20/20 '  With correction:      Dentition: every 6 months  Pain:none  Past Medical History:  Diagnosis Date  . Hyperlipidemia   . Hypertension   . Thyroid disease    Hyperthyroidism    Past Surgical History:  Procedure Laterality Date  . [place], MD  . [place]  08/2014    Social History   Social History  .  Marital status: Married    Spouse name: N/A  . Number of children: N/A  . Years of education: N/A   Social History Main Topics  . Smoking status: Never Smoker  . Smokeless tobacco: Never Used  . Alcohol use 0.0 oz/week     Comment: 1-2  . Drug use: No  . Sexual activity: No   Other Topics Concern  . None   Social History Narrative  . None    Family History  Problem Relation Age of Onset  . Cancer Mother        Breast  . Colitis Mother   . Osteoporosis Mother   . Diabetes Father        Type 2  . Cancer Maternal Grandmother        56  . Cancer Maternal Grandfather        72    Review of Systems:  Review of Systems  Constitutional: Negative for activity change, appetite change, fatigue and unexpected weight change.  HENT: Negative for congestion and hearing loss.     Eyes: Negative.   Respiratory: Negative for cough and shortness of breath.   Cardiovascular: Negative for chest pain, palpitations and leg swelling.  Gastrointestinal: Negative for abdominal pain, constipation and diarrhea.  Genitourinary: Negative for difficulty urinating and dysuria.  Musculoskeletal: Negative for arthralgias and myalgias.  Skin: Negative for color change and wound.  Neurological: Negative for dizziness and weakness.  Psychiatric/Behavioral: Positive for dysphoric mood. Negative for agitation, behavioral problems and confusion. The patient is nervous/anxious.        Depression, increase stress     Allergies as of 02/26/2017   No Known Allergies     Medication List       Accurate as of 02/26/17  1:49 PM. Always use your most recent med list.          citalopram 20 MG tablet Commonly known as:  CELEXA Take 1 tablet (20 mg total) by mouth daily.   levothyroxine 150 MCG tablet Commonly known as:  SYNTHROID, LEVOTHROID Take 150 mcg by mouth daily before breakfast.   Vitamin D (Ergocalciferol) 50000 units Caps capsule Commonly known as:  DRISDOL TAKE 1 CAPSULE BY MOUTH EVERY 7 DAYS         Physical Exam: Vitals:   02/26/17 1345  BP: 138/88  Pulse: 72  Resp: 18  Temp: 97.8 F (36.6 C)  TempSrc: Oral  SpO2: 96%  Weight: 250 lb 3.2 oz (113.5 kg)  Height: '5\' 3"'  (1.6 m)   Body mass index is 44.32 kg/m. Physical Exam  Constitutional: She is oriented to person, place, and time. She appears well-developed and well-nourished. No distress.  HENT:  Head: Normocephalic and atraumatic.  Right Ear: External ear normal.  Left Ear: External ear normal.  Nose: Nose normal.  Mouth/Throat: Oropharynx is clear and moist. No oropharyngeal exudate.  Eyes: Conjunctivae and EOM are normal. Pupils are equal, round, and reactive to light.  Neck: Normal range of motion. Neck supple.  Cardiovascular: Normal rate, regular rhythm and normal heart sounds.    Pulmonary/Chest: Effort normal and breath sounds normal.  Abdominal: Soft. Bowel sounds are normal. She exhibits no distension. There is no tenderness.  Musculoskeletal: Normal range of motion. She exhibits no edema or tenderness.  Neurological: She is alert and oriented to person, place, and time. She has normal reflexes. No cranial nerve deficit.  Skin: Skin is warm and dry. She is not diaphoretic.  Psychiatric: She has a normal mood and  affect. Her behavior is normal.    Labs reviewed: Basic Metabolic Panel: No results for input(s): NA, K, CL, CO2, GLUCOSE, BUN, CREATININE, CALCIUM, MG, PHOS, TSH in the last 8760 hours. Liver Function Tests: No results for input(s): AST, ALT, ALKPHOS, BILITOT, PROT, ALBUMIN in the last 8760 hours. No results for input(s): LIPASE, AMYLASE in the last 8760 hours. No results for input(s): AMMONIA in the last 8760 hours. CBC: No results for input(s): WBC, NEUTROABS, HGB, HCT, MCV, PLT in the last 8760 hours. Lipid Panel: No results for input(s): CHOL, HDL, LDLCALC, TRIG, CHOLHDL, LDLDIRECT in the last 8760 hours. Lab Results  Component Value Date   HGBA1C 5.7 (H) 11/28/2015     Procedures: No results found.  Assessment/Plan 1. Vitamin D deficiency Does not take Vit D Routinely.  - Vitamin D, 25-hydroxy; Future  2. Wellness examination patient was counseled regarding the appropriate use of alcohol, regular self-examination of the breasts on a monthly basis, prevention of dental and periodontal disease, diet, regular sustained exercise for at least 30 minutes 5 times per week,  routine screening interval for mammogram as recommended by the [place] and ACOG, importance of regular PAP smears, smoking cessation, tobacco use,  and recommended schedule for GI hemoccult testing, colonoscopy, cholesterol, thyroid and diabetes screening. -diet changes strongly encouraged as well as increasing exercise to help with mood as well.  - Lipid  Panel; Future - CMP with eGFR; Future - CBC with Differential/Platelets; Future - EKG 12-Lead- NSR  3. Encounter for screening for malignant neoplasm of colon - Ambulatory referral to Gastroenterology  4. Depression, unspecified depression type Not controlled. Encouraged psychologist evaluation and counseling. To take celexa 20 mg daily- missing lots of doses.   5. Postoperative hypothyroidism - TSH; Future -cont on current synthroid dose.   6. Prediabetes -discussed importance of diet changes, dash diet given - Hemoglobin A1c; Future  Jessica K. Harle Battiest  [facility] [phone] 8 am - 5 pm) [phone] (after hours)

## 2017-02-26 NOTE — Patient Instructions (Addendum)
Make an appt to come back next week for fasting blood work Try to meal plan Exercise 150 mins a week or more- very important to get some kind of physical activity daily   DASH Eating Plan DASH stands for "Dietary Approaches to Stop Hypertension." The DASH eating plan is a healthy eating plan that has been shown to reduce high blood pressure (hypertension). It may also reduce your risk for type 2 diabetes, heart disease, and stroke. The DASH eating plan may also help with weight loss. What are tips for following this plan? General guidelines   Avoid eating more than 2,300 mg (milligrams) of salt (sodium) a day. If you have hypertension, you may need to reduce your sodium intake to 1,500 mg a day.  Limit alcohol intake to no more than 1 drink a day for nonpregnant women and 2 drinks a day for men. One drink equals 12 oz of beer, 5 oz of wine, or 1 oz of hard liquor.  Work with your health care provider to maintain a healthy body weight or to lose weight. Ask what an ideal weight is for you.  Get at least 30 minutes of exercise that causes your heart to beat faster (aerobic exercise) most days of the week. Activities may include walking, swimming, or biking.  Work with your health care provider or diet and nutrition specialist (dietitian) to adjust your eating plan to your individual calorie needs. Reading food labels   Check food labels for the amount of sodium per serving. Choose foods with less than 5 percent of the Daily Value of sodium. Generally, foods with less than 300 mg of sodium per serving fit into this eating plan.  To find whole grains, look for the word "whole" as the first word in the ingredient list. Shopping   Buy products labeled as "low-sodium" or "no salt added."  Buy fresh foods. Avoid canned foods and premade or frozen meals. Cooking   Avoid adding salt when cooking. Use salt-free seasonings or herbs instead of table salt or sea salt. Check with your health care  provider or pharmacist before using salt substitutes.  Do not fry foods. Cook foods using healthy methods such as baking, boiling, grilling, and broiling instead.  Cook with heart-healthy oils, such as olive, canola, soybean, or sunflower oil. Meal planning    Eat a balanced diet that includes:  5 or more servings of fruits and vegetables each day. At each meal, try to fill half of your plate with fruits and vegetables.  Up to 6-8 servings of whole grains each day.  Less than 6 oz of lean meat, poultry, or fish each day. A 3-oz serving of meat is about the same size as a deck of cards. One egg equals 1 oz.  2 servings of low-fat dairy each day.  A serving of nuts, seeds, or beans 5 times each week.  Heart-healthy fats. Healthy fats called Omega-3 fatty acids are found in foods such as flaxseeds and coldwater fish, like sardines, salmon, and mackerel.  Limit how much you eat of the following:  Canned or prepackaged foods.  Food that is high in trans fat, such as fried foods.  Food that is high in saturated fat, such as fatty meat.  Sweets, desserts, sugary drinks, and other foods with added sugar.  Full-fat dairy products.  Do not salt foods before eating.  Try to eat at least 2 vegetarian meals each week.  Eat more home-cooked food and less restaurant, buffet, and fast  food.  When eating at a restaurant, ask that your food be prepared with less salt or no salt, if possible. What foods are recommended? The items listed may not be a complete list. Talk with your dietitian about what dietary choices are best for you. Grains  Whole-grain or whole-wheat bread. Whole-grain or whole-wheat pasta. Brown rice. Orpah Cobb. Bulgur. Whole-grain and low-sodium cereals. Pita bread. Low-fat, low-sodium crackers. Whole-wheat flour tortillas. Vegetables  Fresh or frozen vegetables (raw, steamed, roasted, or grilled). Low-sodium or reduced-sodium tomato and vegetable juice. Low-sodium  or reduced-sodium tomato sauce and tomato paste. Low-sodium or reduced-sodium canned vegetables. Fruits  All fresh, dried, or frozen fruit. Canned fruit in natural juice (without added sugar). Meat and other protein foods  Skinless chicken or [place]. Ground chicken or [place]. Pork with fat trimmed off. Fish and seafood. Egg whites. Dried beans, peas, or lentils. Unsalted nuts, nut butters, and seeds. Unsalted canned beans. Lean cuts of beef with fat trimmed off. Low-sodium, lean deli meat. Dairy  Low-fat (1%) or fat-free (skim) milk. Fat-free, low-fat, or reduced-fat cheeses. Nonfat, low-sodium ricotta or cottage cheese. Low-fat or nonfat yogurt. Low-fat, low-sodium cheese. Fats and oils  Soft margarine without trans fats. Vegetable oil. Low-fat, reduced-fat, or light mayonnaise and salad dressings (reduced-sodium). Canola, safflower, olive, soybean, and sunflower oils. Avocado. Seasoning and other foods  Herbs. Spices. Seasoning mixes without salt. Unsalted popcorn and pretzels. Fat-free sweets. What foods are not recommended? The items listed may not be a complete list. Talk with your dietitian about what dietary choices are best for you. Grains  Baked goods made with fat, such as croissants, muffins, or some breads. Dry pasta or rice meal packs. Vegetables  Creamed or fried vegetables. Vegetables in a cheese sauce. Regular canned vegetables (not low-sodium or reduced-sodium). Regular canned tomato sauce and paste (not low-sodium or reduced-sodium). Regular tomato and vegetable juice (not low-sodium or reduced-sodium). Rosita Fire. Olives. Fruits  Canned fruit in a light or heavy syrup. Fried fruit. Fruit in cream or butter sauce. Meat and other protein foods  Fatty cuts of meat. Ribs. Fried meat. Tomasa Blase. Sausage. Bologna and other processed lunch meats. Salami. Fatback. Hotdogs. Bratwurst. Salted nuts and seeds. Canned beans with added salt. Canned or smoked fish. Whole eggs or egg yolks. Chicken  or [place] with skin. Dairy  Whole or 2% milk, cream, and half-and-half. Whole or full-fat cream cheese. Whole-fat or sweetened yogurt. Full-fat cheese. Nondairy creamers. Whipped toppings. Processed cheese and cheese spreads. Fats and oils  Butter. Stick margarine. Lard. Shortening. Ghee. Bacon fat. Tropical oils, such as coconut, palm kernel, or palm oil. Seasoning and other foods  Salted popcorn and pretzels. Onion salt, garlic salt, seasoned salt, table salt, and sea salt. Worcestershire sauce. Tartar sauce. Barbecue sauce. Teriyaki sauce. Soy sauce, including reduced-sodium. Steak sauce. Canned and packaged gravies. Fish sauce. Oyster sauce. Cocktail sauce. Horseradish that you find on the shelf. Ketchup. Mustard. Meat flavorings and tenderizers. Bouillon cubes. Hot sauce and Tabasco sauce. Premade or packaged marinades. Premade or packaged taco seasonings. Relishes. Regular salad dressings. Where to find more information:  National Heart, Lung, and Blood Institute: PopSteam.is  American Heart Association: www.heart.org Summary  The DASH eating plan is a healthy eating plan that has been shown to reduce high blood pressure (hypertension). It may also reduce your risk for type 2 diabetes, heart disease, and stroke.  With the DASH eating plan, you should limit salt (sodium) intake to 2,300 mg a day. If you have hypertension, you may need to  reduce your sodium intake to 1,500 mg a day.  When on the DASH eating plan, aim to eat more fresh fruits and vegetables, whole grains, lean proteins, low-fat dairy, and heart-healthy fats.  Work with your health care provider or diet and nutrition specialist (dietitian) to adjust your eating plan to your individual calorie needs. This information is not intended to replace advice given to you by your health care provider. Make sure you discuss any questions you have with your health care provider. Document Released: 09/25/2011 Document Revised:  09/29/2016 Document Reviewed: 09/29/2016 Elsevier Interactive Patient Education  2017 [employer]

## 2017-04-09 ENCOUNTER — Ambulatory Visit: Payer: Self-pay

## 2017-04-09 ENCOUNTER — Other Ambulatory Visit: Payer: PRIVATE HEALTH INSURANCE

## 2017-04-14 ENCOUNTER — Ambulatory Visit: Payer: PRIVATE HEALTH INSURANCE | Admitting: Nurse Practitioner

## 2017-04-16 ENCOUNTER — Encounter: Payer: Self-pay | Admitting: Nurse Practitioner

## 2017-05-04 ENCOUNTER — Encounter: Payer: Self-pay | Admitting: Nurse Practitioner

## 2017-05-15 ENCOUNTER — Other Ambulatory Visit: Payer: PRIVATE HEALTH INSURANCE

## 2017-05-15 DIAGNOSIS — E890 Postprocedural hypothyroidism: Secondary | ICD-10-CM

## 2017-05-15 DIAGNOSIS — Z0000 Encounter for general adult medical examination without abnormal findings: Secondary | ICD-10-CM

## 2017-05-15 DIAGNOSIS — E559 Vitamin D deficiency, unspecified: Secondary | ICD-10-CM

## 2017-05-15 DIAGNOSIS — R7303 Prediabetes: Secondary | ICD-10-CM

## 2017-05-15 LAB — CBC WITH DIFFERENTIAL/PLATELET
BASOS ABS: 56 {cells}/uL (ref 0–200)
Basophils Relative: 1 %
EOS PCT: 3 %
Eosinophils Absolute: 168 cells/uL (ref 15–500)
HCT: 39.3 % (ref 35.0–45.0)
Hemoglobin: 12.7 g/dL (ref 11.7–15.5)
LYMPHS ABS: 2800 {cells}/uL (ref 850–3900)
Lymphocytes Relative: 50 %
MCH: 28.6 pg (ref 27.0–33.0)
MCHC: 32.3 g/dL (ref 32.0–36.0)
MCV: 88.5 fL (ref 80.0–100.0)
MONO ABS: 392 {cells}/uL (ref 200–950)
MPV: 11.1 fL (ref 7.5–12.5)
Monocytes Relative: 7 %
NEUTROS ABS: 2184 {cells}/uL (ref 1500–7800)
Neutrophils Relative %: 39 %
PLATELETS: 219 10*3/uL (ref 140–400)
RBC: 4.44 MIL/uL (ref 3.80–5.10)
RDW: 13.4 % (ref 11.0–15.0)
WBC: 5.6 10*3/uL (ref 3.8–10.8)

## 2017-05-15 LAB — TSH: TSH: 0.76 m[IU]/L

## 2017-05-16 LAB — VITAMIN D 25 HYDROXY (VIT D DEFICIENCY, FRACTURES): Vit D, 25-Hydroxy: 21 ng/mL — ABNORMAL LOW (ref 30–100)

## 2017-05-16 LAB — COMPLETE METABOLIC PANEL WITH GFR
ALT: 28 U/L (ref 6–29)
AST: 23 U/L (ref 10–35)
Albumin: 4 g/dL (ref 3.6–5.1)
Alkaline Phosphatase: 79 U/L (ref 33–130)
BUN: 12 mg/dL (ref 7–25)
CALCIUM: 9.2 mg/dL (ref 8.6–10.4)
CHLORIDE: 103 mmol/L (ref 98–110)
CO2: 23 mmol/L (ref 20–31)
CREATININE: 0.94 mg/dL (ref 0.50–1.05)
GFR, Est African American: 77 mL/min (ref >=60)
GFR, Est Non African American: 67 mL/min (ref >=60)
Glucose, Bld: 97 mg/dL (ref 65–99)
POTASSIUM: 4.4 mmol/L (ref 3.5–5.3)
SODIUM: 137 mmol/L (ref 135–146)
Total Bilirubin: 0.6 mg/dL (ref 0.2–1.2)
Total Protein: 6.3 g/dL (ref 6.1–8.1)

## 2017-05-16 LAB — HEMOGLOBIN A1C
HEMOGLOBIN A1C: 5.5 % (ref <5.7)
Mean Plasma Glucose: 111 mg/dL

## 2017-05-16 LAB — LIPID PANEL
Cholesterol: 211 mg/dL — ABNORMAL HIGH (ref <200)
HDL: 70 mg/dL (ref >50)
LDL CALC: 118 mg/dL — AB (ref <100)
Total CHOL/HDL Ratio: 3 Ratio (ref <5.0)
Triglycerides: 116 mg/dL (ref <150)
VLDL: 23 mg/dL (ref <30)

## 2017-05-21 ENCOUNTER — Encounter: Payer: Self-pay | Admitting: Nurse Practitioner

## 2017-05-21 ENCOUNTER — Ambulatory Visit (INDEPENDENT_AMBULATORY_CARE_PROVIDER_SITE_OTHER): Payer: PRIVATE HEALTH INSURANCE | Admitting: Nurse Practitioner

## 2017-05-21 VITALS — BP 134/76 | HR 68 | Temp 98.1°F | Resp 17 | Ht 63.0 in | Wt 247.8 lb

## 2017-05-21 DIAGNOSIS — E782 Mixed hyperlipidemia: Secondary | ICD-10-CM | POA: Diagnosis not present

## 2017-05-21 DIAGNOSIS — E2839 Other primary ovarian failure: Secondary | ICD-10-CM

## 2017-05-21 DIAGNOSIS — E559 Vitamin D deficiency, unspecified: Secondary | ICD-10-CM

## 2017-05-21 DIAGNOSIS — F329 Major depressive disorder, single episode, unspecified: Secondary | ICD-10-CM | POA: Diagnosis not present

## 2017-05-21 DIAGNOSIS — Z1211 Encounter for screening for malignant neoplasm of colon: Secondary | ICD-10-CM | POA: Diagnosis not present

## 2017-05-21 DIAGNOSIS — E890 Postprocedural hypothyroidism: Secondary | ICD-10-CM | POA: Diagnosis not present

## 2017-05-21 DIAGNOSIS — F32A Depression, unspecified: Secondary | ICD-10-CM

## 2017-05-21 DIAGNOSIS — R7303 Prediabetes: Secondary | ICD-10-CM | POA: Diagnosis not present

## 2017-05-21 MED ORDER — VITAMIN D (ERGOCALCIFEROL) 1.25 MG (50000 UNIT) PO CAPS: ORAL_CAPSULE | ORAL | 2 refills | Status: DC

## 2017-05-21 MED ORDER — CITALOPRAM HYDROBROMIDE 20 MG PO TABS: 20.0000 mg | ORAL_TABLET | Freq: Every day | ORAL | 3 refills | Status: DC

## 2017-05-21 NOTE — Progress Notes (Signed)
Careteam: Patient Care Team: Sharon SellerEubanks, Nieko Clarin K, NP as PCP - General (Nurse Practitioner) Izell CarolinaPatel, Dhaval, MD as Referring Physician (Endocrinology)  Advanced Directive information Does Patient Have a Medical Advance Directive?: No  No Known Allergies  Chief Complaint  Patient presents with  . Follow-up    Pt is being seen for a follow up on mood and weight. Pt reports she is feeling well. [place] from [place].      HPI: Patient is a 61 y.o. female seen in the office today for routine follow up. Mood- better, just got back from vacation, taking  celexa and has been taking routinely and exercising more.   colonoscopy- mother has polyps and was told to get a colonoscopy every 5 years due for this now. Last GI retired in [place]. Records at her GYN in [place].   Had bone density around 10 year ago. Post menopausal - 10 years ago.   Having pain/ache to left upper shoulder. This has been ongoing for several months.  Notices it more when she moves her head. Responds to aleve.   Review of Systems:  Review of Systems  Constitutional: Negative for chills, fever and weight loss.  HENT: Negative for tinnitus.   Respiratory: Negative for cough, sputum production and shortness of breath.   Cardiovascular: Negative for chest pain, palpitations and leg swelling.  Gastrointestinal: Negative for abdominal pain, constipation, diarrhea and heartburn.  Genitourinary: Negative for dysuria, frequency and urgency.  Musculoskeletal: Positive for myalgias and neck pain. Negative for back pain, falls and joint pain.  Skin: Negative.   Neurological: Negative for dizziness and headaches.  Psychiatric/Behavioral: Positive for depression (improved). Negative for memory loss. The patient does not have insomnia.     Past Medical History:  Diagnosis Date  . Hyperlipidemia   . Hypertension   . Thyroid disease    Hyperthyroidism   Past Surgical History:  Procedure Laterality Date  .  CESAREAN SECTION  1990    Antony BlackbirdFaraban MD  . CESAREAN SECTION  1993   Antony BlackbirdFaraban MD  . CHOLECYSTECTOMY  1996   Clent RidgesWalsh, MD  . EYELID LACERATION REPAIR  08/2014   Social History:   reports that she has never smoked. She has never used smokeless tobacco. She reports that she drinks alcohol. She reports that she does not use drugs.  Family History  Problem Relation Age of Onset  . Cancer Mother        Breast  . Colitis Mother   . Osteoporosis Mother   . Diabetes Father        Type 2  . Cancer Maternal Grandmother        60  . Cancer Maternal Grandfather        1170    Medications: Patient's Medications  New Prescriptions   No medications on file  Previous Medications   CITALOPRAM (CELEXA) 20 MG TABLET    Take 1 tablet (20 mg total) by mouth daily.   LEVOTHYROXINE (SYNTHROID, LEVOTHROID) 150 MCG TABLET    Take 150 mcg by mouth daily before breakfast.   VITAMIN D, ERGOCALCIFEROL, (DRISDOL) 50000 UNITS CAPS CAPSULE    TAKE 1 CAPSULE BY MOUTH EVERY 7 DAYS  Modified Medications   No medications on file  Discontinued Medications   No medications on file     Physical Exam:  Vitals:   05/21/17 1017  BP: 134/76  Pulse: 68  Resp: 17  Temp: 98.1 F (36.7 C)  TempSrc: Oral  SpO2: 97%  Weight: 247 lb 12.8 oz (112.4 kg)  Height: 5\' 3"  (1.6 m)   Body mass index is 43.9 kg/m.  Physical Exam  Constitutional: She is oriented to person, place, and time. She appears well-developed and well-nourished. No distress.  HENT:  Head: Normocephalic and atraumatic.  Nose: Nose normal.  Mouth/Throat: Oropharynx is clear and moist. No oropharyngeal exudate.  Eyes: Pupils are equal, round, and reactive to light. Conjunctivae and EOM are normal.  Neck: Normal range of motion. Neck supple.  Cardiovascular: Normal rate, regular rhythm and normal heart sounds.   Pulmonary/Chest: Effort normal and breath sounds normal.  Abdominal: Soft. Bowel sounds are normal. She exhibits no distension. There is no  tenderness.  Musculoskeletal: Normal range of motion. She exhibits no edema or tenderness.  Neurological: She is alert and oriented to person, place, and time. She has normal reflexes. No cranial nerve deficit.  Skin: Skin is warm and dry. No rash noted. She is not diaphoretic. No erythema.  Psychiatric: She has a normal mood and affect. Her behavior is normal.    Labs reviewed: Basic Metabolic Panel:  Recent Labs  78/29/5607/27/18 0854  NA 137  K 4.4  CL 103  CO2 23  GLUCOSE 97  BUN 12  CREATININE 0.94  CALCIUM 9.2  TSH 0.76   Liver Function Tests:  Recent Labs  05/15/17 0854  AST 23  ALT 28  ALKPHOS 79  BILITOT 0.6  PROT 6.3  ALBUMIN 4.0   No results for input(s): LIPASE, AMYLASE in the last 8760 hours. No results for input(s): AMMONIA in the last 8760 hours. CBC:  Recent Labs  05/15/17 0854  WBC 5.6  NEUTROABS 2,184  HGB 12.7  HCT 39.3  MCV 88.5  PLT 219   Lipid Panel:  Recent Labs  05/15/17 0854  CHOL 211*  HDL 70  LDLCALC 118*  TRIG 116  CHOLHDL 3.0   TSH:  Recent Labs  05/15/17 0854  TSH 0.76   A1C: Lab Results  Component Value Date   HGBA1C 5.5 05/15/2017     Assessment/Plan 1. Vitamin D deficiency Vit D level remains low - Vitamin D, Ergocalciferol, (DRISDOL) 50000 units CAPS capsule; TAKE 1 CAPSULE BY MOUTH EVERY 7 DAYS  Dispense: 12 capsule; Refill: 2  2. Depression, unspecified depression type Improved on celexa since she has been routinely taking it.  - citalopram (CELEXA) 20 MG tablet; Take 1 tablet (20 mg total) by mouth daily.  Dispense: 30 tablet; Refill: 3  3. Prediabetes A1c 5.5. To cont dietary modifications and increase in exercise.   4. Special screening for malignant neoplasms, colon - Ambulatory referral to Gastroenterology  5. Postoperative hypothyroidism -on synthroid 150 mcg daily -TSH WNL  6. Estrogen deficiency - DG Bone Density; Future  7. Mixed hyperlipidemia LDL elevated. Discussed dietary  modifications. Willing to make dietary changes and will follow up labs. Statin intolerant.   8. Muscle strain non tender on exam and does not effect ROM, to use aleve 1 tablet BID for 1 week and to use heating pad 20 mins 2-3 times daily   To follow up in 4 months with lab work prior to visit.  Janene HarveyJessica K. Biagio BorgEubanks, AGNP  [facility] [phone](Monday-Friday 8 am - 5 pm) [phone] (after hours)

## 2017-05-21 NOTE — Patient Instructions (Signed)
Heart-Healthy Eating Plan Many factors influence your heart health, including eating and exercise habits. Heart (coronary) risk increases with abnormal blood fat (lipid) levels. Heart-healthy meal planning includes limiting unhealthy fats, increasing healthy fats, and making other small dietary changes. This includes maintaining a healthy body weight to help keep lipid levels within a normal range.  What types of fat should I choose?  Choose healthy fats more often. Choose monounsaturated and polyunsaturated fats, such as olive oil and canola oil, flaxseeds, walnuts, almonds, and seeds.  Eat more omega-3 fats. Good choices include salmon, mackerel, sardines, tuna, flaxseed oil, and ground flaxseeds. Aim to eat fish at least two times each week.  Limit saturated fats. Saturated fats are primarily found in animal products, such as meats, butter, and cream. Plant sources of saturated fats include palm oil, palm kernel oil, and coconut oil.  Avoid foods with partially hydrogenated oils in them. These contain trans fats. Examples of foods that contain trans fats are stick margarine, some tub margarines, cookies, crackers, and other baked goods. What general guidelines do I need to follow?  Check food labels carefully to identify foods with trans fats or high amounts of saturated fat.  Fill one half of your plate with vegetables and green salads. Eat 4-5 servings of vegetables per day. A serving of vegetables equals 1 cup of raw leafy vegetables,  cup of raw or cooked cut-up vegetables, or  cup of vegetable juice.  Fill one fourth of your plate with whole grains. Look for the word "whole" as the first word in the ingredient list.  Fill one fourth of your plate with lean protein foods.  Eat 4-5 servings of fruit per day. A serving of fruit equals one medium whole fruit,  cup of dried fruit,  cup of fresh, frozen, or canned fruit, or  cup of 100% fruit juice.  Eat more foods that contain soluble  fiber. Examples of foods that contain this type of fiber are apples, broccoli, carrots, beans, peas, and barley. Aim to get 20-30 g of fiber per day.  Eat more home-cooked food and less restaurant, buffet, and fast food.  Limit or avoid alcohol.  Limit foods that are high in starch and sugar.  Avoid fried foods.  Cook foods by using methods other than frying. Baking, boiling, grilling, and broiling are all great options. Other fat-reducing suggestions include: ? Removing the skin from poultry. ? Removing all visible fats from meats. ? Skimming the fat off of stews, soups, and gravies before serving them. ? Steaming vegetables in water or broth.  Lose weight if you are overweight. Losing just 5-10% of your initial body weight can help your overall health and prevent diseases such as diabetes and heart disease.  Increase your consumption of nuts, legumes, and seeds to 4-5 servings per week. One serving of dried beans or legumes equals  cup after being cooked, one serving of nuts equals 1 ounces, and one serving of seeds equals  ounce or 1 tablespoon.  You may need to monitor your salt (sodium) intake, especially if you have high blood pressure. Talk with your health care provider or dietitian to get more information about reducing sodium. What foods can I eat? Grains  Breads, including [place], white, pita, wheat, raisin, rye, oatmeal, and [place]. Tortillas that are neither fried nor made with lard or trans fat. Low-fat rolls, including hotdog and hamburger buns and English muffins. Biscuits. Muffins. Waffles. Pancakes. Light popcorn. Whole-grain cereals. Flatbread. Melba toast. Pretzels. Breadsticks. Rusks. Low-fat  snacks and crackers, including oyster, saltine, matzo, graham, animal, and rye. Rice and pasta, including brown rice and those that are made with whole wheat. Vegetables All vegetables. Fruits All fruits, but limit coconut. Meats and Other Protein Sources Lean, well-trimmed  beef, veal, pork, and lamb. Chicken and [place] without skin. All fish and shellfish. Wild duck, rabbit, pheasant, and venison. Egg whites or low-cholesterol egg substitutes. Dried beans, peas, lentils, and tofu.Seeds and most nuts. Dairy Low-fat or nonfat cheeses, including ricotta, string, and mozzarella. Skim or 1% milk that is liquid, powdered, or evaporated. Buttermilk that is made with low-fat milk. Nonfat or low-fat yogurt. Beverages Mineral water. Diet carbonated beverages. Sweets and Desserts Sherbets and fruit ices. Honey, jam, marmalade, jelly, and syrups. Meringues and gelatins. Pure sugar candy, such as hard candy, jelly beans, gumdrops, mints, marshmallows, and small amounts of dark chocolate. MGM MIRAGE[employer]el food cake. Eat all sweets and desserts in moderation. Fats and Oils Nonhydrogenated (trans-free) margarines. Vegetable oils, including soybean, sesame, sunflower, olive, peanut, safflower, corn, canola, and cottonseed. Salad dressings or mayonnaise that are made with a vegetable oil. Limit added fats and oils that you use for cooking, baking, salads, and as spreads. Other Cocoa powder. Coffee and tea. All seasonings and condiments. The items listed above may not be a complete list of recommended foods or beverages. Contact your dietitian for more options. What foods are not recommended? Grains Breads that are made with saturated or trans fats, oils, or whole milk. Croissants. Butter rolls. Cheese breads. Sweet rolls. Donuts. Buttered popcorn. Chow mein noodles. High-fat crackers, such as cheese or butter crackers. Meats and Other Protein Sources Fatty meats, such as hotdogs, short ribs, sausage, spareribs, bacon, ribeye roast or steak, and mutton. High-fat deli meats, such as salami and bologna. Caviar. Domestic duck and goose. Organ meats, such as kidney, liver, sweetbreads, brains, gizzard, chitterlings, and heart. Dairy Cream, sour cream, cream cheese, and creamed cottage cheese. Whole  milk cheeses, including blue (bleu), [address] [place], [place], [place], [address], [place], [address], [place], [place], and [place]. Whole or 2% milk that is liquid, evaporated, or condensed. Whole buttermilk. Cream sauce or high-fat cheese sauce. Yogurt that is made from whole milk. Beverages Regular sodas and drinks with added sugar. Sweets and Desserts Frosting. Pudding. Cookies. Cakes other than angel food cake. Candy that has milk chocolate or white chocolate, hydrogenated fat, butter, coconut, or unknown ingredients. Buttered syrups. Full-fat ice cream or ice cream drinks. Fats and Oils Gravy that has suet, meat fat, or shortening. Cocoa butter, hydrogenated oils, palm oil, coconut oil, palm kernel oil. These can often be found in baked products, candy, fried foods, nondairy creamers, and whipped toppings. Solid fats and shortenings, including bacon fat, salt pork, lard, and butter. Nondairy cream substitutes, such as coffee creamers and sour cream substitutes. Salad dressings that are made of unknown oils, cheese, or sour cream. The items listed above may not be a complete list of foods and beverages to avoid. Contact your dietitian for more information. This information is not intended to replace advice given to you by your health care provider. Make sure you discuss any questions you have with your health care provider. Document Released: 07/15/2008 Document Revised: 04/25/2016 Document Reviewed: 03/30/2014 Elsevier Interactive Patient Education  2017 Elsevier Inc.   Cholesterol Cholesterol is a white, waxy, fat-like substance that is needed by the human body in small amounts. The liver makes all the cholesterol we need. Cholesterol is carried from the liver by the blood through the blood vessels. Deposits  of cholesterol (plaques) may build up on blood vessel (artery) walls. Plaques make the arteries narrower and stiffer. Cholesterol plaques increase the risk for heart attack and stroke. You cannot  feel your cholesterol level even if it is very high. The only way to know that it is high is to have a blood test. Once you know your cholesterol levels, you should keep a record of the test results. Work with your health care provider to keep your levels in the desired range. What do the results mean?  Total cholesterol is a rough measure of all the cholesterol in your blood.  LDL (low-density lipoprotein) is the "bad" cholesterol. This is the type that causes plaque to build up on the artery walls. You want this level to be low.  HDL (high-density lipoprotein) is the "good" cholesterol because it cleans the arteries and carries the LDL away. You want this level to be high.  Triglycerides are fat that the body can either burn for energy or store. High levels are closely linked to heart disease. What are the desired levels of cholesterol?  Total cholesterol below 200.  LDL below 100 for people who are at risk, below 70 for people at very high risk.  HDL above 40 is good. A level of 60 or higher is considered to be protective against heart disease.  Triglycerides below 150. How can I lower my cholesterol? Diet Follow your diet program as told by your health care provider.  Choose fish or white meat chicken and [place], roasted or baked. Limit fatty cuts of red meat, fried foods, and processed meats, such as sausage and lunch meats.  Eat lots of fresh fruits and vegetables.  Choose whole grains, beans, pasta, potatoes, and cereals.  Choose olive oil, corn oil, or canola oil, and use only small amounts.  Avoid butter, mayonnaise, shortening, or palm kernel oils.  Avoid foods with trans fats.  Drink skim or nonfat milk and eat low-fat or nonfat yogurt and cheeses. Avoid whole milk, cream, ice cream, egg yolks, and full-fat cheeses.  Healthier desserts include angel food cake, ginger snaps, animal crackers, hard candy, popsicles, and low-fat or nonfat frozen yogurt. Avoid pastries,  cakes, pies, and cookies.  Exercise  Follow your exercise program as told by your health care provider. A regular program: ? Helps to decrease LDL and raise HDL. ? Helps with weight control.  Do things that increase your activity level, such as gardening, walking, and taking the stairs.  Ask your health care provider about ways that you can be more active in your daily life.  Medicine  Take over-the-counter and prescription medicines only as told by your health care provider. ? Medicine may be prescribed by your health care provider to help lower cholesterol and decrease the risk for heart disease. This is usually done if diet and exercise have failed to bring down cholesterol levels. ? If you have several risk factors, you may need medicine even if your levels are normal.  This information is not intended to replace advice given to you by your health care provider. Make sure you discuss any questions you have with your health care provider. Document Released: 07/01/2001 Document Revised: 05/03/2016 Document Reviewed: 04/05/2016 Elsevier Interactive Patient Education  2017 [employer]

## 2017-07-08 ENCOUNTER — Encounter: Payer: Self-pay | Admitting: Nurse Practitioner

## 2017-08-04 ENCOUNTER — Ambulatory Visit (INDEPENDENT_AMBULATORY_CARE_PROVIDER_SITE_OTHER): Payer: PRIVATE HEALTH INSURANCE | Admitting: Nurse Practitioner

## 2017-08-04 ENCOUNTER — Encounter: Payer: Self-pay | Admitting: Nurse Practitioner

## 2017-08-04 VITALS — BP 132/76 | HR 65 | Temp 98.6°F | Resp 17 | Ht 63.0 in | Wt 248.6 lb

## 2017-08-04 DIAGNOSIS — J0140 Acute pansinusitis, unspecified: Secondary | ICD-10-CM | POA: Diagnosis not present

## 2017-08-04 DIAGNOSIS — J019 Acute sinusitis, unspecified: Secondary | ICD-10-CM | POA: Insufficient documentation

## 2017-08-04 MED ORDER — AMOXICILLIN-POT CLAVULANATE 875-125 MG PO TABS: 1.0000 | ORAL_TABLET | Freq: Two times a day (BID) | ORAL | 0 refills | Status: DC

## 2017-08-04 NOTE — Progress Notes (Signed)
Careteam: Patient Care Team: Sharon Seller, NP as PCP - General (Nurse Practitioner) Izell [place], MD as Referring Physician (Endocrinology)  Advanced Directive information Does Patient Have a Medical Advance Directive?: No  Allergies  Allergen Reactions  . Lipitor [Atorvastatin] Other (See Comments)    myalgias     Chief Complaint  Patient presents with  . Acute Visit    Pt is being seen due to cough, sore throat, headache, and fatigue x 3 weeks.      HPI: Patient is a 61 y.o. female seen in the office today complaining of cough, sinus congestion, headache, sore throat, fatigue, and sinus drainage x 3 weeks. She says she is coughing up green sputum x 3 weeks. She admits to some nausea but no vomiting. She denies fevers, chills, or diarrhea.  Congestion in sinus and chest.  Has used mucinex DM liquid Overall feels poorly. Reports overall symptoms have worsen.   Review of Systems:  Review of Systems  Constitutional: Positive for malaise/fatigue. Negative for chills, diaphoresis and fever.  HENT: Positive for congestion, sinus pain and sore throat. Negative for ear discharge and ear pain.   Eyes: Negative for pain, discharge and redness.  Respiratory: Positive for cough and sputum production. Negative for shortness of breath.   Cardiovascular: Negative for chest pain and claudication.  Gastrointestinal: Positive for nausea. Negative for abdominal pain, diarrhea and vomiting.  Genitourinary: Negative for dysuria and frequency.  Musculoskeletal: Negative for joint pain, myalgias and neck pain.  Neurological: Positive for headaches. Negative for dizziness and weakness.  Psychiatric/Behavioral: Negative for depression and suicidal ideas. The patient is not nervous/anxious.     Past Medical History:  Diagnosis Date  . Hyperlipidemia   . Hypertension   . Thyroid disease    Hyperthyroidism   Past Surgical History:  Procedure Laterality Date  . CESAREAN SECTION   1990    Antony Blackbird MD  . CESAREAN SECTION  1993   Antony Blackbird MD  . CHOLECYSTECTOMY  1996   Clent Ridges, MD  . EYELID LACERATION REPAIR  08/2014   Social History:   reports that she has never smoked. She has never used smokeless tobacco. She reports that she drinks alcohol. She reports that she does not use drugs.  Family History  Problem Relation Age of Onset  . Cancer Mother        Breast  . Colitis Mother   . Osteoporosis Mother   . Diabetes Father        Type 2  . Cancer Maternal Grandmother        60  . Cancer Maternal Grandfather        39    Medications: Patient's Medications  New Prescriptions   No medications on file  Previous Medications   CITALOPRAM (CELEXA) 20 MG TABLET    Take 1 tablet (20 mg total) by mouth daily.   LEVOTHYROXINE (SYNTHROID, LEVOTHROID) 150 MCG TABLET    Take 150 mcg by mouth daily before breakfast.   VITAMIN D, ERGOCALCIFEROL, (DRISDOL) 50000 UNITS CAPS CAPSULE    TAKE 1 CAPSULE BY MOUTH EVERY 7 DAYS  Modified Medications   No medications on file  Discontinued Medications   No medications on file     Physical Exam:  Vitals:   08/04/17 1520  BP: 132/76  Pulse: 65  Resp: 17  Temp: 98.6 F (37 C)  TempSrc: Oral  SpO2: 97%  Weight: 248 lb 9.6 oz (112.8 kg)  Height:  (1.6 m)   Body  mass index is 44.04 kg/m.  Physical Exam  Constitutional: She is oriented to person, place, and time. She appears well-developed and well-nourished. No distress.  HENT:  Head: Normocephalic and atraumatic.  Right Ear: Tympanic membrane is bulging.  Left Ear: Tympanic membrane is bulging.  Nose: Rhinorrhea present.  Mouth/Throat: No oropharyngeal exudate. Tonsils are 1+ on the left.  Eyes: Pupils are equal, round, and reactive to light. Conjunctivae and EOM are normal. Right eye exhibits no discharge. Left eye exhibits no discharge. No scleral icterus.  Neck: Normal range of motion. Neck supple. No JVD present. No tracheal deviation present.    Cardiovascular: Normal rate, regular rhythm and normal heart sounds.   Pulmonary/Chest: Effort normal and breath sounds normal. No stridor. No respiratory distress. She has no wheezes.  Neurological: She is alert and oriented to person, place, and time.  Skin: Skin is warm and dry. Capillary refill takes less than 2 seconds. She is not diaphoretic.  Psychiatric: She has a normal mood and affect.    Labs reviewed: Basic Metabolic Panel:  Recent Labs  16/10/96 0854  NA 137  K 4.4  CL 103  CO2 23  GLUCOSE 97  BUN 12  CREATININE 0.94  CALCIUM 9.2  TSH 0.76   Liver Function Tests:  Recent Labs  05/15/17 0854  AST 23  ALT 28  ALKPHOS 79  BILITOT 0.6  PROT 6.3  ALBUMIN 4.0   No results for input(s): LIPASE, AMYLASE in the last 8760 hours. No results for input(s): AMMONIA in the last 8760 hours. CBC:  Recent Labs  05/15/17 0854  WBC 5.6  NEUTROABS 2,184  HGB 12.7  HCT 39.3  MCV 88.5  PLT 219   Lipid Panel:  Recent Labs  05/15/17 0854  CHOL 211*  HDL 70  LDLCALC 118*  TRIG 116  CHOLHDL 3.0   TSH:  Recent Labs  05/15/17 0854  TSH 0.76   A1C: Lab Results  Component Value Date   HGBA1C 5.5 05/15/2017     Assessment/Plan 1. Acute non-recurrent pansinusitis Start Augmentin by mouth twice a day for 7 days. Take Mucinex DM by mouht twice daily congestion.  Patient instructed to use Nettie pot to dry up sinus congestion. May use regular strength tylenol for pain. May use Claritin to help dry up sinus drainage and pressure. Return to office if not improving in a week. - amoxicillin-clavulanate (AUGMENTIN) 875-125 MG tablet; Take 1 tablet by mouth 2 (two) times daily.  Dispense: 14 tablet; Refill: 0   Next appt: as scheduled  Ponce Skillman K. Biagio Borg  [facility] [phone] 8 am - 5 pm) [phone] (after hours)

## 2017-08-04 NOTE — Patient Instructions (Addendum)
Take mucinix DM tablet for congestion. Regular strength or Maximum strength. Use this twice a day with full glass of water for 1 week. This will help with chest congestion.  Take OTC Claritin 10 mg (loratadine)   to help dry up sinus congestion. Use Netti pot twice a day (morning and evening) to help dry up sinus congestion.  Do not blow your nose unless you feel movement. If nothing is coming out do not blow nose with force.  Start Augmentin twice a day for 1 week.  Take regular strength tylenol for aches/sore throat if needed.   Sinusitis, Adult Sinusitis is soreness and inflammation of your sinuses. Sinuses are hollow spaces in the bones around your face. Your sinuses are located:  Around your eyes.  In the middle of your forehead.  Behind your nose.  In your cheekbones.  Your sinuses and nasal passages are lined with a stringy fluid (mucus). Mucus normally drains out of your sinuses. When your nasal tissues become inflamed or swollen, the mucus can become trapped or blocked so air cannot flow through your sinuses. This allows bacteria, viruses, and funguses to grow, which leads to infection. Sinusitis can develop quickly and last for 7?10 days (acute) or for more than 12 weeks (chronic). Sinusitis often develops after a cold. What are the causes? This condition is caused by anything that creates swelling in the sinuses or stops mucus from draining, including:  Allergies.  Asthma.  Bacterial or viral infection.  Abnormally shaped bones between the nasal passages.  Nasal growths that contain mucus (nasal polyps).  Narrow sinus openings.  Pollutants, such as chemicals or irritants in the air.  A foreign object stuck in the nose.  A fungal infection. This is rare.  What increases the risk? The following factors may make you more likely to develop this condition:  Having allergies or asthma.  Having had a recent cold or respiratory tract infection.  Having  structural deformities or blockages in your nose or sinuses.  Having a weak immune system.  Doing a lot of swimming or diving.  Overusing nasal sprays.  Smoking.  What are the signs or symptoms? The main symptoms of this condition are pain and a feeling of pressure around the affected sinuses. Other symptoms include:  Upper toothache.  Earache.  Headache.  Bad breath.  Decreased sense of smell and taste.  A cough that may get worse at night.  Fatigue.  Fever.  Thick drainage from your nose. The drainage is often green and it may contain pus (purulent).  Stuffy nose or congestion.  Postnasal drip. This is when extra mucus collects in the throat or back of the nose.  Swelling and warmth over the affected sinuses.  Sore throat.  Sensitivity to light.  How is this diagnosed? This condition is diagnosed based on symptoms, a medical history, and a physical exam. To find out if your condition is acute or chronic, your health care provider may:  Look in your nose for signs of nasal polyps.  Tap over the affected sinus to check for signs of infection.  View the inside of your sinuses using an imaging device that has a light attached (endoscope).  If your health care provider suspects that you have chronic sinusitis, you may also:  Be tested for allergies.  Have a sample of mucus taken from your nose (nasal culture) and checked for bacteria.  Have a mucus sample examined to see if your sinusitis is related to an allergy.  If  your sinusitis does not respond to treatment and it lasts longer than 8 weeks, you may have an MRI or CT scan to check your sinuses. These scans also help to determine how severe your infection is. In rare cases, a bone biopsy may be done to rule out more serious types of fungal sinus disease. How is this treated? Treatment for sinusitis depends on the cause and whether your condition is chronic or acute. If a virus is causing your sinusitis,  your symptoms will go away on their own within 10 days. You may be given medicines to relieve your symptoms, including:  Topical nasal decongestants. They shrink swollen nasal passages and let mucus drain from your sinuses.  Antihistamines. These drugs block inflammation that is triggered by allergies. This can help to ease swelling in your nose and sinuses.  Topical nasal corticosteroids. These are nasal sprays that ease inflammation and swelling in your nose and sinuses.  Nasal saline washes. These rinses can help to get rid of thick mucus in your nose.  If your condition is caused by bacteria, you will be given an antibiotic medicine. If your condition is caused by a fungus, you will be given an antifungal medicine. Surgery may be needed to correct underlying conditions, such as narrow nasal passages. Surgery may also be needed to remove polyps. Follow these instructions at home: Medicines  Take, use, or apply over-the-counter and prescription medicines only as told by your health care provider. These may include nasal sprays.  If you were prescribed an antibiotic medicine, take it as told by your health care provider. Do not stop taking the antibiotic even if you start to feel better. Hydrate and Humidify  Drink enough water to keep your urine clear or pale yellow. Staying hydrated will help to thin your mucus.  Use a cool mist humidifier to keep the humidity level in your home above 50%.  Inhale steam for 10-15 minutes, 3-4 times a day or as told by your health care provider. You can do this in the bathroom while a hot shower is running.  Limit your exposure to cool or dry air. Rest  Rest as much as possible.  Sleep with your head raised (elevated).  Make sure to get enough sleep each night. General instructions  Apply a warm, moist washcloth to your face 3-4 times a day or as told by your health care provider. This will help with discomfort.  Wash your hands often with  soap and water to reduce your exposure to viruses and other germs. If soap and water are not available, use hand sanitizer.  Do not smoke. Avoid being around people who are smoking (secondhand smoke).  Keep all follow-up visits as told by your health care provider. This is important. Contact a health care provider if:  You have a fever.  Your symptoms get worse.  Your symptoms do not improve within 10 days. Get help right away if:  You have a severe headache.  You have persistent vomiting.  You have pain or swelling around your face or eyes.  You have vision problems.  You develop confusion.  Your neck is stiff.  You have trouble breathing. This information is not intended to replace advice given to you by your health care provider. Make sure you discuss any questions you have with your health care provider. Document Released: 10/06/2005 Document Revised: 06/01/2016 Document Reviewed: 08/01/2015 Elsevier Interactive Patient Education  2017 [employer].

## 2017-08-06 LAB — HM MAMMOGRAPHY

## 2017-08-10 ENCOUNTER — Encounter: Payer: Self-pay | Admitting: *Deleted

## 2017-09-23 ENCOUNTER — Other Ambulatory Visit: Payer: PRIVATE HEALTH INSURANCE

## 2017-09-25 ENCOUNTER — Ambulatory Visit: Payer: PRIVATE HEALTH INSURANCE | Admitting: Nurse Practitioner

## 2017-10-08 ENCOUNTER — Other Ambulatory Visit: Payer: Self-pay | Admitting: Nurse Practitioner

## 2017-10-08 DIAGNOSIS — F32A Depression, unspecified: Secondary | ICD-10-CM

## 2017-10-08 DIAGNOSIS — F329 Major depressive disorder, single episode, unspecified: Secondary | ICD-10-CM

## 2017-11-10 ENCOUNTER — Other Ambulatory Visit: Payer: PRIVATE HEALTH INSURANCE

## 2017-11-13 ENCOUNTER — Ambulatory Visit: Payer: PRIVATE HEALTH INSURANCE | Admitting: Nurse Practitioner

## 2017-11-16 ENCOUNTER — Telehealth: Payer: Self-pay

## 2017-11-16 NOTE — Telephone Encounter (Signed)
I called patient to see about scheduling an appointment. Patient missed appt on 1/25/419.

## 2017-11-20 HISTORY — PX: APPENDECTOMY: SHX54

## 2017-12-15 DIAGNOSIS — Z9049 Acquired absence of other specified parts of digestive tract: Secondary | ICD-10-CM | POA: Insufficient documentation

## 2017-12-16 MED ORDER — DIPHENHYDRAMINE HCL 25 MG PO CAPS
25.00 | ORAL_CAPSULE | ORAL | Status: DC
Start: ? — End: 2017-12-16

## 2017-12-16 MED ORDER — SODIUM CHLORIDE 0.9 % IV SOLN
INTRAVENOUS | Status: DC
Start: ? — End: 2017-12-16

## 2017-12-16 MED ORDER — ONDANSETRON 4 MG PO TBDP
4.00 | ORAL_TABLET | ORAL | Status: DC
Start: ? — End: 2017-12-16

## 2017-12-16 MED ORDER — MORPHINE SULFATE 4 MG/ML IJ SOLN
2.00 | INTRAMUSCULAR | Status: DC
Start: ? — End: 2017-12-16

## 2017-12-16 MED ORDER — HYDROCODONE-ACETAMINOPHEN 5-325 MG PO TABS
1.00 | ORAL_TABLET | ORAL | Status: DC
Start: ? — End: 2017-12-16

## 2017-12-16 MED ORDER — IBUPROFEN 800 MG PO TABS
800.00 | ORAL_TABLET | ORAL | Status: DC
Start: ? — End: 2017-12-16

## 2017-12-29 ENCOUNTER — Telehealth: Payer: Self-pay

## 2017-12-29 NOTE — Telephone Encounter (Signed)
I called patient to see about scheduling an appointment. I left a message for patient to call the office.

## 2017-12-29 NOTE — Telephone Encounter (Signed)
-----   Message from Sharon SellerJessica K Eubanks, NP sent at 12/24/2017  9:00 AM EST ----- Pt missed last follow up and lab appt, I see in epic she had an emergency appendectomy so whenever she feels like rescheduling with lab work before visit

## 2018-01-07 NOTE — Telephone Encounter (Signed)
I left a message asking patient to call the office to schedule an appointment

## 2018-01-21 NOTE — Telephone Encounter (Signed)
I left a message asking that patient call the office to schedule an appointment.

## 2018-03-18 ENCOUNTER — Other Ambulatory Visit: Payer: Self-pay | Admitting: Nurse Practitioner

## 2018-03-18 DIAGNOSIS — F329 Major depressive disorder, single episode, unspecified: Secondary | ICD-10-CM

## 2018-03-18 DIAGNOSIS — F32A Depression, unspecified: Secondary | ICD-10-CM

## 2018-04-01 ENCOUNTER — Telehealth: Payer: Self-pay

## 2018-04-01 ENCOUNTER — Other Ambulatory Visit: Payer: Self-pay | Admitting: Nurse Practitioner

## 2018-04-01 DIAGNOSIS — F329 Major depressive disorder, single episode, unspecified: Secondary | ICD-10-CM

## 2018-04-01 DIAGNOSIS — F32A Depression, unspecified: Secondary | ICD-10-CM

## 2018-04-01 MED ORDER — CITALOPRAM HYDROBROMIDE 20 MG PO TABS: 20.0000 mg | ORAL_TABLET | Freq: Every day | ORAL | 0 refills | Status: DC

## 2018-04-01 NOTE — Telephone Encounter (Signed)
Needs appt

## 2018-04-01 NOTE — Telephone Encounter (Signed)
A medication refill was received from pharmacy for citalopram 20 mg. Rx request was pended to provider for approval due to patient not being seen since 07/2017.   A message was left on voicemail for patient asking that she call the office to schedule an appointment.

## 2018-04-01 NOTE — Addendum Note (Signed)
Addended by: Chriss DriverLANE, TERRY L on: 04/01/2018 02:08 PM   Modules accepted: Orders

## 2018-04-01 NOTE — Telephone Encounter (Signed)
I left a message for patient to call the office to schedule an appointment. Patient has not been seen since 08/04/17.

## 2018-04-02 ENCOUNTER — Other Ambulatory Visit: Payer: Self-pay | Admitting: Nurse Practitioner

## 2018-04-02 DIAGNOSIS — E559 Vitamin D deficiency, unspecified: Secondary | ICD-10-CM

## 2018-04-02 NOTE — Telephone Encounter (Signed)
rx pended to provider for approval. Pt has not been seen since 07/2017.

## 2018-04-02 NOTE — Telephone Encounter (Signed)
Needs appt before refills   

## 2018-04-06 NOTE — Telephone Encounter (Signed)
I spoke with patient and she scheduled an appointment for 04/23/18.

## 2018-04-16 ENCOUNTER — Encounter (HOSPITAL_BASED_OUTPATIENT_CLINIC_OR_DEPARTMENT_OTHER): Payer: Self-pay | Admitting: *Deleted

## 2018-04-16 ENCOUNTER — Other Ambulatory Visit: Payer: Self-pay

## 2018-04-16 ENCOUNTER — Emergency Department (HOSPITAL_BASED_OUTPATIENT_CLINIC_OR_DEPARTMENT_OTHER): Payer: No Typology Code available for payment source

## 2018-04-16 ENCOUNTER — Emergency Department (HOSPITAL_BASED_OUTPATIENT_CLINIC_OR_DEPARTMENT_OTHER)
Admission: EM | Admit: 2018-04-16 | Discharge: 2018-04-16 | Disposition: A | Discharge status: Home / Self Care | Payer: No Typology Code available for payment source | Attending: Emergency Medicine | Admitting: Emergency Medicine

## 2018-04-16 DIAGNOSIS — E039 Hypothyroidism, unspecified: Secondary | ICD-10-CM | POA: Diagnosis not present

## 2018-04-16 DIAGNOSIS — B353 Tinea pedis: Secondary | ICD-10-CM | POA: Diagnosis not present

## 2018-04-16 DIAGNOSIS — R21 Rash and other nonspecific skin eruption: Secondary | ICD-10-CM | POA: Diagnosis present

## 2018-04-16 DIAGNOSIS — Z79899 Other long term (current) drug therapy: Secondary | ICD-10-CM | POA: Insufficient documentation

## 2018-04-16 DIAGNOSIS — I10 Essential (primary) hypertension: Secondary | ICD-10-CM | POA: Insufficient documentation

## 2018-04-16 DIAGNOSIS — B029 Zoster without complications: Secondary | ICD-10-CM

## 2018-04-16 LAB — COMPREHENSIVE METABOLIC PANEL
ALK PHOS: 82 U/L (ref 38–126)
ALT: 22 U/L (ref 0–44)
AST: 23 U/L (ref 15–41)
Albumin: 3.9 g/dL (ref 3.5–5.0)
Anion gap: 8 (ref 5–15)
BUN: 15 mg/dL (ref 6–20)
CALCIUM: 9 mg/dL (ref 8.9–10.3)
CO2: 27 mmol/L (ref 22–32)
Chloride: 105 mmol/L (ref 98–111)
Creatinine, Ser: 0.77 mg/dL (ref 0.44–1.00)
GLUCOSE: 94 mg/dL (ref 70–99)
Potassium: 4.2 mmol/L (ref 3.5–5.1)
SODIUM: 140 mmol/L (ref 135–145)
Total Bilirubin: 0.5 mg/dL (ref 0.3–1.2)
Total Protein: 6.8 g/dL (ref 6.5–8.1)

## 2018-04-16 LAB — CBC WITH DIFFERENTIAL/PLATELET
Basophils Absolute: 0 10*3/uL (ref 0.0–0.1)
Basophils Relative: 0 %
EOS ABS: 0.2 10*3/uL (ref 0.0–0.7)
EOS PCT: 2 %
HCT: 39.2 % (ref 36.0–46.0)
HEMOGLOBIN: 13.3 g/dL (ref 12.0–15.0)
LYMPHS PCT: 44 %
Lymphs Abs: 3.4 10*3/uL (ref 0.7–4.0)
MCH: 29.2 pg (ref 26.0–34.0)
MCHC: 33.9 g/dL (ref 30.0–36.0)
MCV: 86 fL (ref 78.0–100.0)
MONOS PCT: 9 %
Monocytes Absolute: 0.7 10*3/uL (ref 0.1–1.0)
Neutro Abs: 3.5 10*3/uL (ref 1.7–7.7)
Neutrophils Relative %: 45 %
Platelets: 199 10*3/uL (ref 150–400)
RBC: 4.56 MIL/uL (ref 3.87–5.11)
RDW: 12.9 % (ref 11.5–15.5)
WBC: 7.7 10*3/uL (ref 4.0–10.5)

## 2018-04-16 LAB — PROTIME-INR
INR: 0.91
Prothrombin Time: 12.2 seconds (ref 11.4–15.2)

## 2018-04-16 MED ORDER — VALACYCLOVIR HCL 1 G PO TABS: 1000.0000 mg | ORAL_TABLET | Freq: Three times a day (TID) | ORAL | 0 refills | Status: AC

## 2018-04-16 MED ORDER — KETOCONAZOLE 2 % EX CREA: 1.0000 "application " | TOPICAL_CREAM | Freq: Every day | CUTANEOUS | 0 refills | Status: DC

## 2018-04-16 NOTE — ED Triage Notes (Signed)
Rash to her right foot x 2 days. Sore, itching. Lesions look like herpes. Some are weeping open others are closed and red. Her toes are swollen.

## 2018-04-16 NOTE — ED Provider Notes (Signed)
[facility] EMERGENCY DEPARTMENT Provider Note   CSN: [id number] Arrival date & time: 04/16/18  1248     History   Chief Complaint Chief Complaint  Patient presents with  . Foot Pain    HPI Kristen Moran is a 62 y.o. female who presents the emergency department chief complaint of foot pain and rash.  Patient states that she had onset of pain and swelling in the middle toe 2 weeks ago.  4 days ago she noticed itching and then she developed a hemorrhagic vesicular rash over the middle 2 toes.  She denies fevers or chills, she denies rash elsewhere.  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  She denies tick bites.  HPI  Past Medical History:  Diagnosis Date  . Hyperlipidemia   . Hypertension   . Thyroid disease    Hyperthyroidism    Patient Active Problem List   Diagnosis Date Noted  . Sinusitis, acute 08/04/2017  . Prediabetes 11/21/2014  . 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 Hyperlipidemia 09/19/2014  . Insomnia 09/19/2014  . Obesity 09/19/2014    Past Surgical History:  Procedure Laterality Date  . CESAREAN SECTION  1990    Antony BlackbirdFaraban MD  . CESAREAN SECTION  1993   Antony BlackbirdFaraban MD  . CHOLECYSTECTOMY  1996   Clent RidgesWalsh, MD  . EYELID LACERATION REPAIR  08/2014     OB History   None      Home Medications    Prior to Admission medications   Medication Sig Start Date End Date Taking? Authorizing Provider  amoxicillin-clavulanate (AUGMENTIN) 875-125 MG tablet Take 1 tablet by mouth 2 (two) times daily. 08/04/17   Sharon SellerEubanks, Jessica K, NP  citalopram (CELEXA) 20 MG tablet Take 1 tablet (20 mg total) by mouth daily. **MUST HAVE FOLLOW UP FOR FURTHER REFILLS** 04/01/18   Sharon SellerEubanks, Jessica K, NP  levothyroxine (SYNTHROID, LEVOTHROID) 150 MCG tablet Take 150 mcg by mouth daily before breakfast.    [provider]  Vitamin D, Ergocalciferol, (DRISDOL) 50000 units CAPS capsule TAKE 1 CAPSULE BY MOUTH EVERY 7 DAYS 04/02/18    Sharon SellerEubanks, Jessica K, NP    Family History Family History  Problem Relation Age of Onset  . Cancer Mother        Breast  . Colitis Mother   . Osteoporosis Mother   . Diabetes Father        Type 2  . Cancer Maternal Grandmother        60  . Cancer Maternal Grandfather        6570    Social History Social History   Tobacco Use  . Smoking status: Never Smoker  . Smokeless tobacco: Never Used  Substance Use Topics  . Alcohol use: Yes    Alcohol/week: 0.0 oz    Comment: 1-2  . Drug use: No     Allergies   Lipitor [atorvastatin]   Review of Systems Review of Systems  Ten systems reviewed and are negative for acute change, except as noted in the HPI.   Physical Exam Updated Vital Signs BP (!) 146/73   Pulse 65   Temp 98.3 F (36.8 C) (Oral)   Resp 16   Ht 5' 4.5" (1.638 m)   Wt 99.8 kg (220 lb)   SpO2 99%   BMI 37.18 kg/m   Physical Exam  Constitutional: She is oriented to person, place, and time. She appears well-developed and well-nourished. No distress.  HENT:  Head: Normocephalic and  atraumatic.  Eyes: Pupils are equal, round, and reactive to light. Conjunctivae and EOM are normal. No scleral icterus.  Neck: Normal range of motion.  Cardiovascular: Normal rate, regular rhythm and normal heart sounds. Exam reveals no gallop and no friction rub.  No murmur heard. Pulmonary/Chest: Effort normal and breath sounds normal. No respiratory distress.  Abdominal: Soft. Bowel sounds are normal. She exhibits no distension and no mass. There is no tenderness. There is no guarding.  Neurological: She is alert and oriented to person, place, and time.  Skin: Skin is warm and dry. Rash noted. She is not diaphoretic.  Hemorrhagic vesicles over the second and third toes of the right foot.  Dorsal surface only.  There is some macerated tissue between the toes otherwise. No other evidence of vesicles, or petechiae either through the skin or on the mucosal surfaces.    Psychiatric: Her behavior is normal.  Nursing note and vitals reviewed.    ED Treatments / Results  Labs (all labs ordered are listed, but only abnormal results are displayed) Labs Reviewed - No data to display  EKG None  Radiology No results found.  Procedures Procedures (including critical care time)  Medications Ordered in ED Medications - No data to display   Initial Impression / Assessment and Plan / ED Course  I have reviewed the triage vital signs and the nursing notes.  Pertinent labs & imaging results that were available during my care of the patient were reviewed by me and considered in my medical decision making (see chart for details).     Patient labs negative for any acute abnormality.  This may be an outbreak of herpes zoster given his distribution.  Patient will be given treatment with Valtrex.  She has some underlying fungal infection was given ketoconazole cream.  Will also follow-up with dermatology I called the office to get her closer appointment.  Patient appears appropriate for discharge at this time.  Discussed return precautions  Final Clinical Impressions(s) / ED Diagnoses   Final diagnoses:  Herpes zoster without complication  Tinea pedis of both feet    ED Discharge Orders    None       Arthor Captain, PA-C 04/16/18 2344    Tegeler, Canary Brim, MD 04/17/18 [phone]

## 2018-04-16 NOTE — Discharge Instructions (Signed)
Contact a health care provider if: °Your pain is not relieved with prescribed medicines. °Your pain does not get better after the rash heals. °Your rash looks infected. Signs of infection include redness, swelling, and pain that lasts or increases. °Get help right away if: °The rash is on your face or nose. °You have facial pain, pain around your eye area, or loss of feeling on one side of your face. °You have ear pain or you have ringing in your ear. °You have loss of taste. °Your condition gets worse. °

## 2018-04-23 ENCOUNTER — Ambulatory Visit: Payer: PRIVATE HEALTH INSURANCE | Admitting: Nurse Practitioner

## 2018-05-07 ENCOUNTER — Encounter: Payer: Self-pay | Admitting: Internal Medicine

## 2018-05-07 ENCOUNTER — Ambulatory Visit (INDEPENDENT_AMBULATORY_CARE_PROVIDER_SITE_OTHER): Payer: PRIVATE HEALTH INSURANCE | Admitting: Internal Medicine

## 2018-05-07 VITALS — BP 122/68 | HR 60 | Temp 97.7°F | Ht 63.0 in | Wt 219.0 lb

## 2018-05-07 DIAGNOSIS — F329 Major depressive disorder, single episode, unspecified: Secondary | ICD-10-CM

## 2018-05-07 DIAGNOSIS — E2839 Other primary ovarian failure: Secondary | ICD-10-CM | POA: Diagnosis not present

## 2018-05-07 DIAGNOSIS — E890 Postprocedural hypothyroidism: Secondary | ICD-10-CM

## 2018-05-07 DIAGNOSIS — F32A Depression, unspecified: Secondary | ICD-10-CM

## 2018-05-07 LAB — TSH: TSH: 0.36 mIU/L — ABNORMAL LOW (ref 0.40–4.50)

## 2018-05-07 LAB — T4, FREE: Free T4: 1.9 ng/dL — ABNORMAL HIGH (ref 0.8–1.8)

## 2018-05-07 NOTE — Progress Notes (Signed)
Patient ID: Kristen Moran, female   DOB: Aug 26, 1956, 62 y.o.   MRN: [id number]   Location:  [facility] OFFICE  Provider: DR Elmon Kirschner  Code Status:  Goals of Care:  Advanced Directives 04/16/2018  Does Patient Have a Medical Advance Directive? No  Type of Advance Directive -  Does patient want to make changes to medical advance directive? -  Copy of Healthcare Power of Attorney in Chart? -  Would patient like information on creating a medical advance directive? No - Patient declined     Chief Complaint  Patient presents with  . Medical Management of Chronic Issues    Routine follow-up   . Health Maintenance    Discuss DEXA, Hep C, and Colon CA screening recommendation  . Immunizations    Discuss Shingrix recommendation  . Medication Refill    Refill Levothyroxine and Ketoconazole (not on medication)     HPI: Patient is a 62 y.o. female seen today for medical management of chronic diseases.  She was dx with right foot HZ on 04/16/18 at ED. She was rx valtrex and ketoconazole topical. She stopped take valtrex 2/2 intense nausea.  She was hospitalized in Feb 2019 2/2 appendicitis. She is s/p lap appy by Dr Lovie Macadamia at [facility]. No further abdominal pain.  Obesity - she has intentional wt loss and is down 29 lbs since Oct 2018.  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   Adjustment d/o with anxiety/depression - mood stable on citalopram. 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  Knee pain - stable on aleve  Past Medical History:  Diagnosis Date  . Hyperlipidemia   . Hypertension   . Thyroid disease    Hyperthyroidism    Past Surgical History:  Procedure Laterality Date  . APPENDECTOMY  11/2017  . CESAREAN SECTION  1990    Antony Blackbird MD  . CESAREAN SECTION  1993   Antony Blackbird MD  . CHOLECYSTECTOMY  1996   Clent Ridges, MD  . EYELID LACERATION REPAIR  08/2014     reports that she has never smoked. She has never used  smokeless tobacco. She reports that she drinks alcohol. She reports that she does not use drugs. Social History   Socioeconomic History  . Marital status: Married    Spouse name: Not on file  . Number of children: Not on file  . Years of education: Not on file  . Highest education level: Not on file  Occupational History  . Not on file  Social Needs  . Financial resource strain: Not on file  . Food insecurity:    Worry: Not on file    Inability: Not on file  . Transportation needs:    Medical: Not on file    Non-medical: Not on file  Tobacco Use  . Smoking status: Never Smoker  . Smokeless tobacco: Never Used  Substance and Sexual Activity  . Alcohol use: Yes    Alcohol/week: 0.0 oz    Comment: 1-2  . Drug use: No  . Sexual activity: Never  Lifestyle  . Physical activity:    Days per week: Not on file    Minutes per session: Not on file  . Stress: Not on file  Relationships  . Social connections:    Talks on phone: Not on file    Gets together: Not on file    Attends religious service: Not on file    Active member of club or organization: Not  on file    Attends meetings of clubs or organizations: Not on file    Relationship status: Not on file  . Intimate partner violence:    Fear of current or ex partner: Not on file    Emotionally abused: Not on file    Physically abused: Not on file    Forced sexual activity: Not on file  Other Topics Concern  . Not on file  Social History Narrative  . Not on file    Family History  Problem Relation Age of Onset  . Cancer Mother        Breast  . Colitis Mother   . Osteoporosis Mother   . Diabetes Father        Type 2  . Cancer Maternal Grandmother        60  . Cancer Maternal Grandfather        1470    Allergies  Allergen Reactions  . Lipitor [Atorvastatin] Other (See Comments)    myalgias     Outpatient Encounter Medications as of 05/07/2018  Medication Sig  . citalopram (CELEXA) 20 MG tablet Take 1 tablet  (20 mg total) by mouth daily. **MUST HAVE FOLLOW UP FOR FURTHER REFILLS**  . levothyroxine (SYNTHROID, LEVOTHROID) 150 MCG tablet Take 150 mcg by mouth daily before breakfast.  . Vitamin D, Ergocalciferol, (DRISDOL) 50000 units CAPS capsule TAKE 1 CAPSULE BY MOUTH EVERY 7 DAYS  . ketoconazole (NIZORAL) 2 % cream Apply 1 application topically daily. (Patient not taking: Reported on 05/07/2018)  . [DISCONTINUED] amoxicillin-clavulanate (AUGMENTIN) 875-125 MG tablet Take 1 tablet by mouth 2 (two) times daily.   No facility-administered encounter medications on file as of 05/07/2018.     Review of Systems:  Review of Systems  Musculoskeletal: Positive for arthralgias and joint swelling.  All other systems reviewed and are negative.   Health Maintenance  Topic Date Due  . PAP SMEAR  11/21/2017  . Hepatitis C Screening  08/17/2020 (Originally June 22, 1956)  . HIV Screening  08/17/2020 (Originally 07/06/1971)  . INFLUENZA VACCINE  05/20/2018  . MAMMOGRAM  08/07/2019  . COLONOSCOPY  10/20/2020  . TETANUS/TDAP  10/20/2024    Physical Exam: Vitals:   05/07/18 1119  BP: 122/68  Pulse: 60  Temp: 97.7 F (36.5 C)  TempSrc: Oral  SpO2: 97%  Weight: 219 lb (99.3 kg)  Height: 5\' 3"  (1.6 m)   Body mass index is 38.79 kg/m. Physical Exam  Constitutional: She is oriented to person, place, and time. She appears well-developed and well-nourished.  HENT:  Mouth/Throat: Oropharynx is clear and moist. No oropharyngeal exudate.  MMM; no oral thrush  Eyes: Pupils are equal, round, and reactive to light. No scleral icterus.  Neck: Neck supple. Carotid bruit is not present. No tracheal deviation present. No thyromegaly present.  Cardiovascular: Normal rate, regular rhythm and intact distal pulses. Exam reveals no gallop and no friction rub.  Murmur (1/6 SEM) heard. No LE edema b/l. no calf TTP.   Pulmonary/Chest: Effort normal and breath sounds normal. No stridor. No respiratory distress. She has no  wheezes. She has no rales.  Abdominal: Soft. Normal appearance and bowel sounds are normal. She exhibits no distension and no mass. There is no hepatomegaly. There is no tenderness. There is no rigidity, no rebound and no guarding. No hernia.  obese  Musculoskeletal: She exhibits edema (small and large joint).  Lymphadenopathy:    She has no cervical adenopathy.  Neurological: She is alert and oriented to person, place, and  time. She has normal reflexes.  Skin: Skin is warm and dry. No rash noted.  Psychiatric: She has a normal mood and affect. Her behavior is normal. Judgment and thought content normal.    Labs reviewed: Basic Metabolic Panel: Recent Labs    05/15/17 0854 04/16/18 1528  NA 137 140  K 4.4 4.2  CL 103 105  CO2 23 27  GLUCOSE 97 94  BUN 12 15  CREATININE 0.94 0.77  CALCIUM 9.2 9.0  TSH 0.76  --    Liver Function Tests: Recent Labs    05/15/17 0854 04/16/18 1528  AST 23 23  ALT 28 22  ALKPHOS 79 82  BILITOT 0.6 0.5  PROT 6.3 6.8  ALBUMIN 4.0 3.9   No results for input(s): LIPASE, AMYLASE in the last 8760 hours. No results for input(s): AMMONIA in the last 8760 hours. CBC: Recent Labs    05/15/17 0854 04/16/18 1509  WBC 5.6 7.7  NEUTROABS 2,184 3.5  HGB 12.7 13.3  HCT 39.3 39.2  MCV 88.5 86.0  PLT 219 199   Lipid Panel: Recent Labs    05/15/17 0854  CHOL 211*  HDL 70  LDLCALC 118*  TRIG 116  CHOLHDL 3.0   Lab Results  Component Value Date   HGBA1C 5.5 05/15/2017    Procedures since last visit: Dg Foot Complete Right  Result Date: 04/16/2018 CLINICAL DATA:  Anterior foot pain for a few days with no injury EXAM: RIGHT FOOT COMPLETE - 3+ VIEW COMPARISON:  None. FINDINGS: No fracture, erosion, or malalignment. Dorsal forefoot soft tissue swelling is likely. No gas or opaque foreign body. Spurring at the great toe sesamoids and first MTP joint. IMPRESSION: No acute finding. Electronically Signed   By: Marnee Spring M.D.   On: 04/16/2018  15:21    Assessment/Plan   ICD-10-CM   1. 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 Depression, unspecified depression type F32.9   3. Estrogen deficiency E28.39 DG Bone Density   Will call with lab results  Will call with imaging appts  Follow up with Shanda Bumps in 4 mos for thyroid, depression  Kristen Moran S. Ancil Linsey  [facility] [address] [place], [place] [address] [phone] Cell (Monday-Friday 8 AM - 5 PM) [phone] After 5 PM and follow prompts

## 2018-05-07 NOTE — Patient Instructions (Addendum)
Will call with lab results  Will call with imaging appts  Follow up with Shanda BumpsJessica in 4 mos for thyroid, depression

## 2018-06-23 ENCOUNTER — Other Ambulatory Visit: Payer: Self-pay | Admitting: Nurse Practitioner

## 2018-06-23 DIAGNOSIS — F329 Major depressive disorder, single episode, unspecified: Secondary | ICD-10-CM

## 2018-06-23 DIAGNOSIS — F32A Depression, unspecified: Secondary | ICD-10-CM

## 2018-06-25 ENCOUNTER — Telehealth: Payer: Self-pay

## 2018-06-25 ENCOUNTER — Ambulatory Visit
Admission: RE | Admit: 2018-06-25 | Discharge: 2018-06-25 | Disposition: A | Discharge status: Home / Self Care | Payer: No Typology Code available for payment source | Source: Ambulatory Visit | Attending: Internal Medicine | Admitting: Internal Medicine

## 2018-06-25 DIAGNOSIS — E2839 Other primary ovarian failure: Secondary | ICD-10-CM

## 2018-06-25 NOTE — Telephone Encounter (Signed)
Patient was at [facility] for a Bone Density. Patient's prior bone density was done at [facility] and it is recommended that patient's go to previous location otherwise she will have a new baseline and no comparisons will be done.  I asked caller to consult with patient to see if she requested to go come there [facility]). Per patient she does not mind going back to [facility].  Caller with [facility] recommended that we ask patient where last bone density was done and note that in the comment section to avoid future mixup's   A new order will need to be placed and processed for [facility]  Order pending for signature

## 2018-06-26 ENCOUNTER — Other Ambulatory Visit: Payer: Self-pay | Admitting: Nurse Practitioner

## 2018-06-26 DIAGNOSIS — F329 Major depressive disorder, single episode, unspecified: Secondary | ICD-10-CM

## 2018-06-26 DIAGNOSIS — F32A Depression, unspecified: Secondary | ICD-10-CM

## 2018-07-20 ENCOUNTER — Ambulatory Visit (INDEPENDENT_AMBULATORY_CARE_PROVIDER_SITE_OTHER): Payer: PRIVATE HEALTH INSURANCE | Admitting: Nurse Practitioner

## 2018-07-20 ENCOUNTER — Encounter: Payer: Self-pay | Admitting: Nurse Practitioner

## 2018-07-20 VITALS — BP 110/70 | HR 63 | Temp 98.1°F | Ht 63.0 in | Wt 217.4 lb

## 2018-07-20 DIAGNOSIS — F329 Major depressive disorder, single episode, unspecified: Secondary | ICD-10-CM

## 2018-07-20 DIAGNOSIS — F32A Depression, unspecified: Secondary | ICD-10-CM

## 2018-07-20 DIAGNOSIS — E890 Postprocedural hypothyroidism: Secondary | ICD-10-CM

## 2018-07-20 DIAGNOSIS — E782 Mixed hyperlipidemia: Secondary | ICD-10-CM | POA: Diagnosis not present

## 2018-07-20 MED ORDER — CITALOPRAM HYDROBROMIDE 20 MG PO TABS: 20.0000 mg | ORAL_TABLET | Freq: Every day | ORAL | 5 refills | Status: DC

## 2018-07-20 NOTE — Progress Notes (Signed)
Careteam: Patient Care Team: Sharon Seller, NP as PCP - General (Nurse Practitioner) Izell Casa [place], MD as Referring Physician (Endocrinology)  Advanced Directive information    Allergies  Allergen Reactions  . Lipitor [Atorvastatin] Other (See Comments)    myalgias     Chief Complaint  Patient presents with  . Medical Management of Chronic Issues    3 month follow up  . Immunizations    Patient refused flu vaccine, said she will get at her clinic     HPI: Patient is a 62 y.o. female seen in the office today for 3 months follow up.   Obesity- ongoing weight loss, down to 213 lb before she went on vacation, goal is to lose 50 more lb. Goal weight is 150 lb. She has increased exercise-doing dance 3-4 times a week.   Hypothyroid- TSH slightly low in July, no adjustment made in medication, taking synthroid 150 mcg  Anxiety/depression- stable on celexa, currently taking 20 mg daily and notices the biggest improvement on medication.   Review of Systems:  Review of Systems  Constitutional: Negative for chills, fever and weight loss.  HENT: Negative for tinnitus.   Respiratory: Negative for cough, sputum production and shortness of breath.   Cardiovascular: Negative for chest pain, palpitations and leg swelling.  Gastrointestinal: Negative for abdominal pain, constipation, diarrhea and heartburn.  Genitourinary: Negative for dysuria, frequency and urgency.  Musculoskeletal: Positive for myalgias and neck pain. Negative for back pain, falls and joint pain.  Skin: Negative.   Neurological: Negative for dizziness and headaches.  Psychiatric/Behavioral: Positive for depression (improved). Negative for memory loss. The patient does not have insomnia.     Past Medical History:  Diagnosis Date  . Hyperlipidemia   . Hypertension   . Thyroid disease    Hyperthyroidism   Past Surgical History:  Procedure Laterality Date  . APPENDECTOMY  11/2017  . CESAREAN SECTION  1990     Antony Blackbird MD  . CESAREAN SECTION  1993   Antony Blackbird MD  . CHOLECYSTECTOMY  1996   Clent Ridges, MD  . EYELID LACERATION REPAIR  08/2014   Social History:   reports that she has never smoked. She has never used smokeless tobacco. She reports that she drinks alcohol. She reports that she does not use drugs.  Family History  Problem Relation Age of Onset  . Cancer Mother        Breast  . Colitis Mother   . Osteoporosis Mother   . Diabetes Father        Type 2  . Cancer Maternal Grandmother        60  . Cancer Maternal Grandfather        49    Medications: Patient's Medications  New Prescriptions   No medications on file  Previous Medications   CITALOPRAM (CELEXA) 20 MG TABLET    Take 1 tablet (20 mg total) by mouth daily.   LEVOTHYROXINE (SYNTHROID, LEVOTHROID) 150 MCG TABLET    Take 150 mcg by mouth daily before breakfast.  Modified Medications   No medications on file  Discontinued Medications   CITALOPRAM (CELEXA) 20 MG TABLET    Take 1 tablet (20 mg total) by mouth daily. **MUST HAVE FOLLOW UP FOR FURTHER REFILLS**   KETOCONAZOLE (NIZORAL) 2 % CREAM    Apply 1 application topically daily.   VITAMIN D, ERGOCALCIFEROL, (DRISDOL) 50000 UNITS CAPS CAPSULE    TAKE 1 CAPSULE BY MOUTH EVERY 7 DAYS     Physical Exam:  Vitals:   07/20/18 1421  BP: 110/70  Pulse: 63  Temp: 98.1 F (36.7 C)  TempSrc: Oral  SpO2: 98%  Weight: 217 lb 6.4 oz (98.6 kg)  Height: 5\' 3"  (1.6 m)   Body mass index is 38.51 kg/m.  Physical Exam  Constitutional: She is oriented to person, place, and time. She appears well-developed and well-nourished. No distress.  HENT:  Head: Normocephalic and atraumatic.  Nose: Nose normal.  Mouth/Throat: Oropharynx is clear and moist. No oropharyngeal exudate.  Eyes: Pupils are equal, round, and reactive to light. Conjunctivae and EOM are normal.  Neck: Normal range of motion. Neck supple.  Cardiovascular: Normal rate, regular rhythm and normal heart sounds.    Pulmonary/Chest: Effort normal and breath sounds normal.  Abdominal: Soft. Bowel sounds are normal. She exhibits no distension. There is no tenderness.  Musculoskeletal: Normal range of motion. She exhibits no edema or tenderness.  Neurological: She is alert and oriented to person, place, and time. She has normal reflexes. No cranial nerve deficit.  Skin: Skin is warm and dry. No rash noted. She is not diaphoretic. No erythema.  Psychiatric: She has a normal mood and affect. Her behavior is normal.    Labs reviewed: Basic Metabolic Panel: Recent Labs    04/16/18 1528 05/07/18 1214  NA 140  --   K 4.2  --   CL 105  --   CO2 27  --   GLUCOSE 94  --   BUN 15  --   CREATININE 0.77  --   CALCIUM 9.0  --   TSH  --  0.36*   Liver Function Tests: Recent Labs    04/16/18 1528  AST 23  ALT 22  ALKPHOS 82  BILITOT 0.5  PROT 6.8  ALBUMIN 3.9   No results for input(s): LIPASE, AMYLASE in the last 8760 hours. No results for input(s): AMMONIA in the last 8760 hours. CBC: Recent Labs    04/16/18 1509  WBC 7.7  NEUTROABS 3.5  HGB 13.3  HCT 39.2  MCV 86.0  PLT 199   Lipid Panel: No results for input(s): CHOL, HDL, LDLCALC, TRIG, CHOLHDL, LDLDIRECT in the last 8760 hours. TSH: Recent Labs    05/07/18 1214  TSH 0.36*   A1C: Lab Results  Component Value Date   HGBA1C 5.5 05/15/2017     Assessment/Plan 1. Postablative hypothyroidism -on synthroid 150 mcg, TSH borderline low on last check, will follow up - TSH; Future - T4, Free; Future  2. Mixed hyperlipidemia Diet controlled.  - Lipid Panel; Future  3. Depression, unspecified depression type Improved on celexa - citalopram (CELEXA) 20 MG tablet; Take 1 tablet (20 mg total) by mouth daily.  Dispense: 30 tablet; Refill: 5  Next appt: 07/22/2018 Janene Harvey. Biagio Borg  [facility] [phone]

## 2018-07-20 NOTE — Patient Instructions (Signed)
Keep up the good work with lifestyle modifications!!!  Make appt for fasting labs later this week.

## 2018-07-21 NOTE — Addendum Note (Signed)
Addended by: Avel Peace on: 07/21/2018 08:29 AM   Modules accepted: Orders

## 2018-07-22 ENCOUNTER — Other Ambulatory Visit: Payer: PRIVATE HEALTH INSURANCE

## 2018-07-22 DIAGNOSIS — E782 Mixed hyperlipidemia: Secondary | ICD-10-CM

## 2018-07-22 DIAGNOSIS — E559 Vitamin D deficiency, unspecified: Secondary | ICD-10-CM

## 2018-07-22 DIAGNOSIS — F329 Major depressive disorder, single episode, unspecified: Secondary | ICD-10-CM

## 2018-07-22 DIAGNOSIS — F32A Depression, unspecified: Secondary | ICD-10-CM

## 2018-07-22 DIAGNOSIS — E890 Postprocedural hypothyroidism: Secondary | ICD-10-CM

## 2018-07-23 ENCOUNTER — Telehealth: Payer: Self-pay

## 2018-07-23 LAB — BASIC METABOLIC PANEL WITH GFR
BUN: 16 mg/dL (ref 7–25)
CO2: 26 mmol/L (ref 20–32)
Calcium: 9.4 mg/dL (ref 8.6–10.4)
Chloride: 105 mmol/L (ref 98–110)
Creat: 0.75 mg/dL (ref 0.50–0.99)
GFR, Est African American: 100 mL/min/{1.73_m2} (ref >=60)
GFR, Est Non African American: 86 mL/min/{1.73_m2} (ref >=60)
GLUCOSE: 91 mg/dL (ref 65–99)
POTASSIUM: 4.7 mmol/L (ref 3.5–5.3)
SODIUM: 138 mmol/L (ref 135–146)

## 2018-07-23 LAB — TSH: TSH: 0.27 mIU/L — ABNORMAL LOW (ref 0.40–4.50)

## 2018-07-23 LAB — LIPID PANEL
Cholesterol: 210 mg/dL — ABNORMAL HIGH (ref <200)
HDL: 79 mg/dL (ref >50)
LDL Cholesterol (Calc): 114 mg/dL (calc) — ABNORMAL HIGH
NON-HDL CHOLESTEROL (CALC): 131 mg/dL — AB (ref <130)
Total CHOL/HDL Ratio: 2.7 (calc) (ref <5.0)
Triglycerides: 80 mg/dL (ref <150)

## 2018-07-23 LAB — VITAMIN D 25 HYDROXY (VIT D DEFICIENCY, FRACTURES): Vit D, 25-Hydroxy: 26 ng/mL — ABNORMAL LOW (ref 30–100)

## 2018-07-23 LAB — T4, FREE: FREE T4: 1.6 ng/dL (ref 0.8–1.8)

## 2018-07-23 MED ORDER — VITAMIN D (ERGOCALCIFEROL) 1.25 MG (50000 UNIT) PO CAPS: 50000.0000 [IU] | ORAL_CAPSULE | ORAL | 3 refills | Status: DC

## 2018-07-23 MED ORDER — LEVOTHYROXINE SODIUM 137 MCG PO TABS: 137.0000 ug | ORAL_TABLET | Freq: Every day | ORAL | 6 refills | Status: DC

## 2018-07-23 NOTE — Telephone Encounter (Signed)
Medication list has been updated and rx have been sent to pharmacy.

## 2018-07-23 NOTE — Telephone Encounter (Signed)
-----   Message from Sharon Seller, NP sent at 07/23/2018  8:50 AM EDT ----- Vit D level is low, recommended to start Vit D 50,000 units weekly at this time.  Cholesterol is stable, LDL (bad cholesterol) is unchanged- remains slightly elevated, to continue on diet modifications (heart healthy diet-, avoid sweets and to control the amount of carbohydrates you eat (starchy foods such as flour, bread, and potatoes), avoid high fat dairy (like ice cream, whole milk, cheeses), can buy a heart healthy cook book from the americ[employer] association to help with meal planning).  TSH remains slightly low, lets reduce synthroid to 137 mcg and follow up TSH in 8 weeks to follow.

## 2018-08-05 LAB — HM MAMMOGRAPHY

## 2018-08-10 ENCOUNTER — Encounter: Payer: Self-pay | Admitting: *Deleted

## 2018-09-09 ENCOUNTER — Other Ambulatory Visit: Payer: Self-pay

## 2018-09-09 DIAGNOSIS — E890 Postprocedural hypothyroidism: Secondary | ICD-10-CM

## 2018-09-10 ENCOUNTER — Other Ambulatory Visit: Payer: Self-pay

## 2018-09-10 DIAGNOSIS — E890 Postprocedural hypothyroidism: Secondary | ICD-10-CM

## 2018-09-13 ENCOUNTER — Other Ambulatory Visit: Payer: PRIVATE HEALTH INSURANCE

## 2018-09-13 LAB — TSH: TSH: 0.74 mIU/L (ref 0.40–4.50)

## 2018-10-05 ENCOUNTER — Other Ambulatory Visit: Payer: Self-pay | Admitting: Nurse Practitioner

## 2018-10-20 DIAGNOSIS — G4733 Obstructive sleep apnea (adult) (pediatric): Secondary | ICD-10-CM | POA: Insufficient documentation

## 2018-10-20 HISTORY — DX: Obstructive sleep apnea (adult) (pediatric): G47.33

## 2018-11-16 ENCOUNTER — Other Ambulatory Visit: Payer: Self-pay | Admitting: Nurse Practitioner

## 2018-11-16 DIAGNOSIS — F32A Depression, unspecified: Secondary | ICD-10-CM

## 2018-11-16 DIAGNOSIS — F329 Major depressive disorder, single episode, unspecified: Secondary | ICD-10-CM

## 2018-12-24 ENCOUNTER — Other Ambulatory Visit: Payer: Self-pay | Admitting: Nurse Practitioner

## 2019-01-20 ENCOUNTER — Encounter: Payer: PRIVATE HEALTH INSURANCE | Admitting: Nurse Practitioner

## 2019-01-21 ENCOUNTER — Encounter: Payer: PRIVATE HEALTH INSURANCE | Admitting: Nurse Practitioner

## 2019-01-21 ENCOUNTER — Other Ambulatory Visit: Payer: Self-pay

## 2019-02-13 ENCOUNTER — Encounter: Payer: Self-pay | Admitting: Nurse Practitioner

## 2019-04-30 ENCOUNTER — Other Ambulatory Visit: Payer: Self-pay | Admitting: Nurse Practitioner

## 2019-08-28 ENCOUNTER — Other Ambulatory Visit: Payer: Self-pay | Admitting: Nurse Practitioner

## 2019-08-28 DIAGNOSIS — F32A Depression, unspecified: Secondary | ICD-10-CM

## 2019-08-28 DIAGNOSIS — F329 Major depressive disorder, single episode, unspecified: Secondary | ICD-10-CM

## 2019-08-29 ENCOUNTER — Other Ambulatory Visit (HOSPITAL_BASED_OUTPATIENT_CLINIC_OR_DEPARTMENT_OTHER): Payer: Self-pay

## 2019-08-29 DIAGNOSIS — R0683 Snoring: Secondary | ICD-10-CM

## 2019-08-29 DIAGNOSIS — G4730 Sleep apnea, unspecified: Secondary | ICD-10-CM

## 2019-09-05 LAB — BASIC METABOLIC PANEL
BUN: 13 (ref 4–21)
CO2: 26 — AB (ref 13–22)
Chloride: 102 (ref 99–108)
Creatinine: 1 (ref 0.5–1.1)
Glucose: 99
Potassium: 4.6 (ref 3.4–5.3)
Sodium: 139 (ref 137–147)

## 2019-09-05 LAB — HEPATIC FUNCTION PANEL
ALT: 25 (ref 7–35)
AST: 24 (ref 13–35)
Alkaline Phosphatase: 89 (ref 25–125)
Bilirubin, Total: 0.7

## 2019-09-05 LAB — COMPREHENSIVE METABOLIC PANEL
Albumin: 4.6 (ref 3.5–5.0)
Calcium: 9.9 (ref 8.7–10.7)

## 2019-09-05 LAB — VITAMIN D 25 HYDROXY (VIT D DEFICIENCY, FRACTURES): Vit D, 25-Hydroxy: 17.5

## 2019-09-05 LAB — HM PAP SMEAR: HM Pap smear: NEGATIVE

## 2019-09-05 LAB — NOVEL CORONAVIRUS, NAA: SARS-CoV-2, NAA: NONREACTIVE

## 2019-09-05 LAB — TSH: TSH: 1.51 (ref 0.41–5.90)

## 2019-09-05 LAB — HEMOGLOBIN A1C: Hemoglobin A1C: 5.8

## 2019-09-07 LAB — LIPID PANEL
Cholesterol: 253 — AB (ref 0–200)
HDL: 88 — AB (ref 35–70)
LDL Cholesterol: 140
Triglycerides: 123 (ref 40–160)

## 2019-09-07 LAB — CBC AND DIFFERENTIAL
HCT: 40 (ref 36–46)
Hemoglobin: 13.4 (ref 12.0–16.0)
Platelets: 203 (ref 150–399)
WBC: 5.6

## 2019-09-23 ENCOUNTER — Other Ambulatory Visit (HOSPITAL_COMMUNITY)
Admission: RE | Admit: 2019-09-23 | Discharge: 2019-09-23 | Disposition: A | Discharge status: Home / Self Care | Payer: No Typology Code available for payment source | Source: Ambulatory Visit | Attending: Internal Medicine | Admitting: Internal Medicine

## 2019-09-23 DIAGNOSIS — Z20828 Contact with and (suspected) exposure to other viral communicable diseases: Secondary | ICD-10-CM | POA: Diagnosis not present

## 2019-09-23 DIAGNOSIS — Z01812 Encounter for preprocedural laboratory examination: Secondary | ICD-10-CM | POA: Diagnosis present

## 2019-09-23 LAB — SARS CORONAVIRUS 2 (TAT 6-24 HRS): SARS Coronavirus 2: NEGATIVE

## 2019-09-24 ENCOUNTER — Other Ambulatory Visit: Payer: Self-pay

## 2019-09-24 ENCOUNTER — Ambulatory Visit (HOSPITAL_BASED_OUTPATIENT_CLINIC_OR_DEPARTMENT_OTHER)
Payer: No Typology Code available for payment source | Attending: Obstetrics and Gynecology | Admitting: Internal Medicine

## 2019-09-24 DIAGNOSIS — R0683 Snoring: Secondary | ICD-10-CM | POA: Diagnosis present

## 2019-09-24 DIAGNOSIS — G4730 Sleep apnea, unspecified: Secondary | ICD-10-CM

## 2019-09-24 DIAGNOSIS — I493 Ventricular premature depolarization: Secondary | ICD-10-CM | POA: Insufficient documentation

## 2019-09-24 DIAGNOSIS — G4733 Obstructive sleep apnea (adult) (pediatric): Secondary | ICD-10-CM | POA: Insufficient documentation

## 2019-09-26 ENCOUNTER — Other Ambulatory Visit: Payer: Self-pay

## 2019-09-28 ENCOUNTER — Other Ambulatory Visit: Payer: Self-pay | Admitting: Nurse Practitioner

## 2019-09-28 DIAGNOSIS — F329 Major depressive disorder, single episode, unspecified: Secondary | ICD-10-CM

## 2019-09-28 DIAGNOSIS — F32A Depression, unspecified: Secondary | ICD-10-CM

## 2019-10-02 DIAGNOSIS — R0683 Snoring: Secondary | ICD-10-CM

## 2019-10-02 NOTE — Procedures (Signed)
Patient Name: Kristen Moran, Vari Date: 09/24/2019 Gender: Female D.O.B: 01/13/1956 Age (years): 71 Referring Provider: Baird Lyons MD, ABSM Height (inches): 64 Interpreting Physician: Baird Lyons MD, ABSM Weight (lbs): 235 RPSGT: [place], [place] BMI: 40 MRN: [id number] Neck Size: 15.00  CLINICAL INFORMATION Sleep Study Type: Split Night CPAP Indication for sleep study: Snoring, Witnessed Apneas Epworth Sleepiness Score: 21  SLEEP STUDY TECHNIQUE As per the AASM Manual for the Scoring of Sleep and Associated Events v2.3 (April 2016) with a hypopnea requiring 4% desaturations.  The channels recorded and monitored were frontal, central and occipital EEG, electrooculogram (EOG), submentalis EMG (chin), nasal and oral airflow, thoracic and abdominal wall motion, anterior tibialis EMG, snore microphone, electrocardiogram, and pulse oximetry. Continuous positive airway pressure (CPAP) was initiated when the patient met split night criteria and was titrated according to treat sleep-disordered breathing.  MEDICATIONS Medications self-administered by patient taken the night of the study : none reported  RESPIRATORY PARAMETERS Diagnostic  Total AHI (/hr): 33.7 RDI (/hr): 44.9 OA Index (/hr): - CA Index (/hr): 0.0 REM AHI (/hr): 35.1 NREM AHI (/hr): 33.5 Supine AHI (/hr): 50.3 Non-supine AHI (/hr): 22.9 Min O2 Sat (%): 87.0 Mean O2 (%): 94.3 Time below 88% (min): 1.4   Titration  Optimal Pressure (cm): 12 AHI at Optimal Pressure (/hr): 0.0 Min O2 at Optimal Pressure (%): 93.0 Supine % at Optimal (%): 0 Sleep % at Optimal (%): 100   SLEEP ARCHITECTURE The recording time for the entire night was 438.6 minutes.  During a baseline period of 249.8 minutes, the patient slept for 155.0 minutes in REM and nonREM, yielding a sleep efficiency of 62.1%%. Sleep onset after lights out was 62.3 minutes with a REM latency of 167.0 minutes. The patient spent 14.2%% of the night in stage N1  sleep, 61.9%% in stage N2 sleep, 10.6%% in stage N3 and 13.2% in REM.   During the titration period of 177.8 minutes, the patient slept for 38.3 minutes in REM and nonREM, yielding a sleep efficiency of 21.6%%. Sleep onset after CPAP initiation was 103.0 minutes with a REM latency of N/A minutes. The patient spent 14.3%% of the night in stage N1 sleep, 85.7%% in stage N2 sleep, 0.0%% in stage N3 and 0% in REM.  CARDIAC DATA The 2 lead EKG demonstrated sinus rhythm. The mean heart rate was 100.0 beats per minute. Other EKG findings include: PVCs.  LEG MOVEMENT DATA The total Periodic Limb Movements of Sleep (PLMS) were 0. The PLMS index was 0.0 .  IMPRESSIONS - Severe obstructive sleep apnea occurred during the diagnostic portion of the study (AHI = 33.7/hour). An optimal PAP pressure was selected for this patient ( 12 cm of water) - No significant central sleep apnea occurred during the diagnostic portion of the study (CAI = 0.0/hour). - The patient had minimal or no oxygen desaturation during the diagnostic portion of the study (Min O2 = 87.0%). Min sat at CPAP 12 was 93%. - The patient snored with moderate snoring volume during the diagnostic portion of the study. - EKG findings include PVCs. - Clinically significant periodic limb movements did not occur during sleep.  DIAGNOSIS - Obstructive Sleep Apnea (327.23 [G47.33 ICD-10])  RECOMMENDATIONS - Patient had difficulty adjusting to CPAP and may do best with autopap 5-15. A sleep aid may be useful initially. - Patient used a Small size Resmed Full Face Mask AirFit F30 mask and heated humidification. - Sleep hygiene should be reviewed to assess factors that may improve sleep quality. -  Weight management and regular exercise should be initiated or continued.  [Electronically signed] 10/02/2019 11:06 AM  Baird Lyons MD, ABSM Diplomate, American Board of Sleep Medicine   NPI: [id number]                          [place], [facility]  ELECTRONICALLY SIGNED ON:  10/02/2019, 10:53 AM [place] PH: [phone]   FX: [phone] [place]

## 2019-10-10 ENCOUNTER — Ambulatory Visit (INDEPENDENT_AMBULATORY_CARE_PROVIDER_SITE_OTHER): Payer: PRIVATE HEALTH INSURANCE | Admitting: Nurse Practitioner

## 2019-10-10 ENCOUNTER — Encounter: Payer: Self-pay | Admitting: Nurse Practitioner

## 2019-10-10 ENCOUNTER — Telehealth: Payer: Self-pay | Admitting: Internal Medicine

## 2019-10-10 ENCOUNTER — Other Ambulatory Visit: Payer: Self-pay

## 2019-10-10 VITALS — BP 126/82 | HR 66 | Temp 98.0°F | Ht 63.0 in | Wt 245.0 lb

## 2019-10-10 DIAGNOSIS — Z6841 Body Mass Index (BMI) 40.0 and over, adult: Secondary | ICD-10-CM

## 2019-10-10 DIAGNOSIS — E890 Postprocedural hypothyroidism: Secondary | ICD-10-CM

## 2019-10-10 DIAGNOSIS — E559 Vitamin D deficiency, unspecified: Secondary | ICD-10-CM | POA: Diagnosis not present

## 2019-10-10 DIAGNOSIS — F329 Major depressive disorder, single episode, unspecified: Secondary | ICD-10-CM | POA: Diagnosis not present

## 2019-10-10 DIAGNOSIS — E782 Mixed hyperlipidemia: Secondary | ICD-10-CM

## 2019-10-10 DIAGNOSIS — E2839 Other primary ovarian failure: Secondary | ICD-10-CM | POA: Diagnosis not present

## 2019-10-10 DIAGNOSIS — F32A Depression, unspecified: Secondary | ICD-10-CM

## 2019-10-10 DIAGNOSIS — R7303 Prediabetes: Secondary | ICD-10-CM

## 2019-10-10 MED ORDER — VITAMIN D (ERGOCALCIFEROL) 1.25 MG (50000 UNIT) PO CAPS: 50000.0000 [IU] | ORAL_CAPSULE | ORAL | 1 refills | Status: DC

## 2019-10-10 MED ORDER — CITALOPRAM HYDROBROMIDE 20 MG PO TABS: 20.0000 mg | ORAL_TABLET | Freq: Every day | ORAL | 1 refills | Status: DC

## 2019-10-10 MED ORDER — ROSUVASTATIN CALCIUM 5 MG PO TABS: 5.0000 mg | ORAL_TABLET | Freq: Every day | ORAL | 0 refills | Status: DC

## 2019-10-10 MED ORDER — LEVOTHYROXINE SODIUM 137 MCG PO TABS: ORAL_TABLET | ORAL | 1 refills | Status: DC

## 2019-10-10 NOTE — Telephone Encounter (Signed)
Pt has been scheduled for a sleep consult with AO 12/22 at 4pm.   Spoke with Dr. Carlota Raspberry letting her know about this and she verbalized understanding. Nothing further needed.

## 2019-10-10 NOTE — Progress Notes (Signed)
Careteam: Patient Care Team: Sharon Seller, NP as PCP - General (Nurse Practitioner) Izell La Honda, MD as Referring Physician (Endocrinology)  Advanced Directive information Does Patient Have a Medical Advance Directive?: No, Would patient like information on creating a medical advance directive?: Yes (MAU/Ambulatory/Procedural Areas - Information given)  Allergies  Allergen Reactions  . Lipitor [Atorvastatin] Other (See Comments)    myalgias     Chief Complaint  Patient presents with  . Medical Management of Chronic Issues    yearly follow up and discuss labs from GYN.   . Medication Management    Discuss if patient should restart prescribed Vit D     HPI: Patient is a 63 y.o. female seen in the office today for yearly follow up.   Had PAP done Sep 05, 2019- negative.   Hyperlipidemia- LDL 140, diet is awful, knows it what is she eating but still eating it. In [employer] working 6 days a week.  Wants to go somewhere to "reset" Tried Lipitor and hated it. She felt like she was walking in mud  Family hx of heart disease  Vit d def- taking OTC but not using routinely  Colonoscopy scheduled next week  Baseline bone density a few years ago. Needs to follow up with this. [facility] [place] [address] [facility].  Review of Systems:  Review of Systems  Constitutional: Negative for chills, fever and weight loss.  HENT: Negative for tinnitus.   Respiratory: Negative for cough, sputum production and shortness of breath.   Cardiovascular: Negative for chest pain, palpitations and leg swelling.  Gastrointestinal: Negative for abdominal pain, constipation, diarrhea and heartburn.  Genitourinary: Negative for dysuria, frequency and urgency.  Musculoskeletal: Negative for back pain, falls, joint pain and myalgias.  Skin: Negative.   Neurological: Negative for dizziness and headaches.    Psychiatric/Behavioral: Negative for depression and memory loss. The patient does not have insomnia.     Past Medical History:  Diagnosis Date  . Hyperlipidemia   . Hypertension   . Thyroid disease    Hyperthyroidism   Past Surgical History:  Procedure Laterality Date  . APPENDECTOMY  11/2017  . CESAREAN SECTION  1990    Antony Blackbird MD  . CESAREAN SECTION  1993   Antony Blackbird MD  . CHOLECYSTECTOMY  1996   Clent Ridges, MD  . EYELID LACERATION REPAIR  08/2014   Social History:   reports that she has never smoked. She has never used smokeless tobacco. She reports current alcohol use. She reports that she does not use drugs.  Family History  Problem Relation Age of Onset  . Cancer Mother        Breast  . Colitis Mother   . Osteoporosis Mother   . Diabetes Father        Type 2  . Cancer Maternal Grandmother        60  . Cancer Maternal Grandfather        16    Medications: Patient's Medications  New Prescriptions   No medications on file  Previous Medications   ASPIRIN EC 81 MG TABLET    Take 81 mg by mouth as needed.   CHOLECALCIFEROL 100 MCG (4000 UT) CAPS    Take 1 capsule by mouth daily.   CITALOPRAM (CELEXA) 20 MG TABLET    Take 1 tablet (20 mg total) by mouth daily. Appointment OVERDUE, 1 by mouth daily   LEVOTHYROXINE (SYNTHROID)  137 MCG TABLET    TAKE 1 TABLET BY MOUTH DAILY BEFORE BREAKFAST.   VITAMIN D, ERGOCALCIFEROL, (DRISDOL) 1.25 MG (50000 UT) CAPS CAPSULE    TAKE 1 CAPSULE (50,000 UNITS TOTAL) BY MOUTH EVERY 7 (SEVEN) DAYS.  Modified Medications   No medications on file  Discontinued Medications   No medications on file    Physical Exam:  Vitals:   10/10/19 1527  BP: 126/82  Pulse: 66  Temp: 98 F (36.7 C)  TempSrc: Temporal  SpO2: 98%  Weight: 245 lb (111.1 kg)  Height: 5\' 3"  (1.6 m)   Body mass index is 43.4 kg/m. Wt Readings from Last 3 Encounters:  10/10/19 245 lb (111.1 kg)  09/24/19 235 lb (106.6 kg)  07/20/18 217 lb 6.4 oz (98.6 kg)     Physical Exam Constitutional:      General: She is not in acute distress.    Appearance: She is well-developed. She is not diaphoretic.  HENT:     Head: Normocephalic and atraumatic.     Mouth/Throat:     Pharynx: No oropharyngeal exudate.  Eyes:     Conjunctiva/sclera: Conjunctivae normal.     Pupils: Pupils are equal, round, and reactive to light.  Cardiovascular:     Rate and Rhythm: Normal rate and regular rhythm.     Heart sounds: Normal heart sounds.  Pulmonary:     Effort: Pulmonary effort is normal.     Breath sounds: Normal breath sounds.  Abdominal:     General: Bowel sounds are normal.     Palpations: Abdomen is soft.  Musculoskeletal:        General: No tenderness.     Cervical back: Normal range of motion and neck supple.  Skin:    General: Skin is warm and dry.  Neurological:     Mental Status: She is alert and oriented to person, place, and time.     Labs reviewed: Basic Metabolic Panel: Recent Labs    09/05/19 0000  NA 139  K 4.6  CL 102  CO2 26*  BUN 13  CREATININE 1.0  CALCIUM 9.9  TSH 1.51   Liver Function Tests: Recent Labs    09/05/19 0000  AST 24  ALT 25  ALKPHOS 89  ALBUMIN 4.6   No results for input(s): LIPASE, AMYLASE in the last 8760 hours. No results for input(s): AMMONIA in the last 8760 hours. CBC: Recent Labs    09/05/19 0000  WBC 5.6  HGB 13.4  HCT 40  PLT 203   Lipid Panel: Recent Labs    09/05/19 0000  CHOL 253*  HDL 88*  LDLCALC 140  TRIG 123   TSH: Recent Labs    09/05/19 0000  TSH 1.51   A1C: Lab Results  Component Value Date   HGBA1C 5.8 09/05/2019     Assessment/Plan 1. Vitamin D deficiency Low on recent labs, will restart vit D weekly. - Vitamin D, Ergocalciferol, (DRISDOL) 1.25 MG (50000 UT) CAPS capsule; Take 1 capsule (50,000 Units total) by mouth every 7 (seven) days.  Dispense: 12 capsule; Refill: 1  2. Mixed hyperlipidemia -discussed dietary modification and to make lifestyle  changes. Pt with strong family hx of heart disease, will start statin at this time. Agreeable to start low dose crestor 2.5 for 2 weeks then to increase to crestor to 5 mg daily - rosuvastatin (CRESTOR) 5 MG tablet; Take 1 tablet (5 mg total) by mouth daily.  Dispense: 90 tablet; Refill: 0  3. Estrogen deficiency -  DG Bone Density; Future  4. Depression, unspecified depression type -well controlled on celexa 20 mg daily,  - citalopram (CELEXA) 20 MG tablet; Take 1 tablet (20 mg total) by mouth daily. Appointment OVERDUE, 1 by mouth daily  Dispense: 90 tablet; Refill: 1  5. Postablative hypothyroidism -TSH stable.  - levothyroxine (SYNTHROID) 137 MCG tablet; TAKE 1 TABLET BY MOUTH DAILY BEFORE BREAKFAST.  Dispense: 90 tablet; Refill: 1  6. Prediabetes -glucose was stable on labs drawn by GYN. Reviewed with pt today.  7. Obesity -discussed need for weight loss, to make lifestyle changes with dietary modifications and increase activity.  Next appt: 3 months with lab work before.  Carlos American. [place], [facility] [phone]

## 2019-10-10 NOTE — Patient Instructions (Addendum)
Food prep for week Make a plan for when you can exercise  Start Crestor 2.5 mg by mouth daily in the evening for 2 weeks then increase to 5 mg if tolerating well.   3 months with lab work before

## 2019-10-10 NOTE — Telephone Encounter (Signed)
Patient needs to be scheduled for a sleep consult with one of our sleep docs to further assess.  Attempted to call pt to get her scheduled for a sleep consult but unable to reach. Left pt detailed message to call office to schedule a sleep consult per message received from Dr. Carlota Raspberry.  When pt calls back, please schedule her for a sleep consult appt. Thanks!

## 2019-10-11 ENCOUNTER — Encounter: Payer: Self-pay | Admitting: Pulmonary Disease

## 2019-10-11 ENCOUNTER — Ambulatory Visit (INDEPENDENT_AMBULATORY_CARE_PROVIDER_SITE_OTHER): Payer: No Typology Code available for payment source | Admitting: Pulmonary Disease

## 2019-10-11 ENCOUNTER — Other Ambulatory Visit: Payer: Self-pay | Admitting: *Deleted

## 2019-10-11 VITALS — BP 136/82 | HR 97 | Temp 97.2°F | Ht 63.0 in | Wt 244.8 lb

## 2019-10-11 DIAGNOSIS — G4733 Obstructive sleep apnea (adult) (pediatric): Secondary | ICD-10-CM | POA: Diagnosis not present

## 2019-10-11 DIAGNOSIS — F32A Depression, unspecified: Secondary | ICD-10-CM

## 2019-10-11 DIAGNOSIS — F329 Major depressive disorder, single episode, unspecified: Secondary | ICD-10-CM

## 2019-10-11 DIAGNOSIS — E559 Vitamin D deficiency, unspecified: Secondary | ICD-10-CM

## 2019-10-11 DIAGNOSIS — E782 Mixed hyperlipidemia: Secondary | ICD-10-CM

## 2019-10-11 DIAGNOSIS — E890 Postprocedural hypothyroidism: Secondary | ICD-10-CM

## 2019-10-11 MED ORDER — CITALOPRAM HYDROBROMIDE 20 MG PO TABS: 20.0000 mg | ORAL_TABLET | Freq: Every day | ORAL | 1 refills | Status: DC

## 2019-10-11 MED ORDER — ROSUVASTATIN CALCIUM 5 MG PO TABS: 5.0000 mg | ORAL_TABLET | Freq: Every day | ORAL | 1 refills | Status: DC

## 2019-10-11 MED ORDER — LEVOTHYROXINE SODIUM 137 MCG PO TABS: ORAL_TABLET | ORAL | 1 refills | Status: DC

## 2019-10-11 MED ORDER — ESZOPICLONE 2 MG PO TABS: 2.0000 mg | ORAL_TABLET | Freq: Every evening | ORAL | 1 refills | Status: DC | PRN

## 2019-10-11 MED ORDER — VITAMIN D (ERGOCALCIFEROL) 1.25 MG (50000 UNIT) PO CAPS: 50000.0000 [IU] | ORAL_CAPSULE | ORAL | 1 refills | Status: DC

## 2019-10-11 NOTE — Patient Instructions (Addendum)
Obstructive sleep apnea  DME referral  Auto titrating CPAP settings of 5-12  Small size Resmed Full Face Mask AirFit F30 mask  and heated humidification  Lunesta 2 mg nightly to aid sleep  I will see you in 3 months  Cut down to 1 mg of Lunesta if it makes you drowsy in the mornings

## 2019-10-11 NOTE — Progress Notes (Signed)
Subjective:    Patient ID: Kristen Moran, female    DOB: [birth year], 63 y.o.   MRN: [id number]  Patient has been seen for evaluation of sleep disordered breathing  History of snoring, witnessed apneas Dryness of her mouth in the middle of the night Multiple awakenings  2 brothers with obstructive sleep apnea  Always tired, sleepy  Headaches in the morning Memory is good  poor concentration  Never feels rested  Usual bedtime about 9 PM Takes about 30 minutes to fall asleep 1-2 awakenings at night Final awakening time 6:30 in the morning about 30 pound weight gain  Recently had a sleep study showing severe obstructive sleep apnea Titrated to a pressure of 12    Past Medical History:  Diagnosis Date  . Hyperlipidemia   . Hypertension   . Thyroid disease    Hyperthyroidism   Social History   Socioeconomic History  . Marital status: Married    Spouse name: Not on file  . Number of children: Not on file  . Years of education: Not on file  . Highest education level: Not on file  Occupational History  . Not on file  Tobacco Use  . Smoking status: Never Smoker  . Smokeless tobacco: Never Used  Substance and Sexual Activity  . Alcohol use: Yes    Alcohol/week: 0.0 standard drinks    Comment: 1-2 a month  . Drug use: No  . Sexual activity: Never  Other Topics Concern  . Not on file  Social History Narrative  . Not on file   Social Determinants of Health   Financial Resource Strain:   . Difficulty of Paying Living Expenses: Not on file  Food Insecurity:   . Worried About Charity fundraiser in the Last Year: Not on file  . Ran Out of Food in the Last Year: Not on file  Transportation Needs:   . Lack of Transportation (Medical): Not on file  . Lack of Transportation (Non-Medical): Not on file  Physical Activity:   . Days of Exercise per Week: Not on file  . Minutes of Exercise per Session: Not on file  Stress:   . Feeling of Stress : Not on file   Social Connections:   . Frequency of Communication with Friends and Family: Not on file  . Frequency of Social Gatherings with Friends and Family: Not on file  . Attends Religious Services: Not on file  . Active Member of Clubs or Organizations: Not on file  . Attends Archivist Meetings: Not on file  . Marital Status: Not on file  Intimate Partner Violence:   . Fear of Current or Ex-Partner: Not on file  . Emotionally Abused: Not on file  . Physically Abused: Not on file  . Sexually Abused: Not on file   Family History  Problem Relation Age of Onset  . Cancer Mother        Breast  . Colitis Mother   . Osteoporosis Mother   . Diabetes Father        Type 2  . Cancer Maternal Grandmother        53  . Cancer Maternal Grandfather        41   Review of Systems  Constitutional: Positive for unexpected weight change. Negative for fever.  HENT: Positive for congestion and rhinorrhea. Negative for dental problem, ear pain, nosebleeds, postnasal drip, sinus pressure, sneezing, sore throat and trouble swallowing.   Eyes: Negative for redness and itching.  Respiratory: Positive for cough and shortness of breath. Negative for chest tightness and wheezing.   Cardiovascular: Positive for palpitations. Negative for leg swelling.  Gastrointestinal: Negative for nausea and vomiting.  Genitourinary: Negative for dysuria.  Musculoskeletal: Negative for joint swelling.  Skin: Negative for rash.  Allergic/Immunologic: Negative.  Negative for environmental allergies, food allergies and immunocompromised state.  Neurological: Positive for headaches.  Hematological: Does not bruise/bleed easily.  Psychiatric/Behavioral: Positive for dysphoric mood and self-injury. The patient is not nervous/anxious.       Objective:   Physical Exam Constitutional:      Appearance: She is obese.  HENT:     Mouth/Throat:     Mouth: Mucous membranes are moist.     Comments: Mallampati 2 Eyes:      Pupils: Pupils are equal, round, and reactive to light.  Cardiovascular:     Rate and Rhythm: Normal rate and regular rhythm.     Pulses: Normal pulses.     Heart sounds: Normal heart sounds. No murmur. No friction rub.  Pulmonary:     Effort: Pulmonary effort is normal. No respiratory distress.     Breath sounds: Normal breath sounds. No stridor. No wheezing or rhonchi.  Musculoskeletal:     Cervical back: Normal range of motion and neck supple. No rigidity or tenderness.  Skin:    General: Skin is warm and dry.  Neurological:     General: No focal deficit present.     Mental Status: She is alert.  Psychiatric:        Mood and Affect: Mood normal.        Behavior: Behavior normal.    Vitals:   10/11/19 1612  BP: 136/82  Pulse: 97  Temp: (!) 97.2 F (36.2 C)  SpO2: 99%   Results of the Epworth flowsheet 10/11/2019  Sitting and reading 3  Watching TV 3  Sitting, inactive in a public place (e.g. a theatre or a meeting) 2  As a passenger in a car for an hour without a break 3  Lying down to rest in the afternoon when circumstances permit 3  Sitting and talking to someone 1  Sitting quietly after a lunch without alcohol 2  In a car, while stopped for a few minutes in traffic 1  Total score 18   Sleep study reviewed with patient showing severe obstructive sleep apnea performed 09/24/2019     Assessment & Plan:  .  Severe obstructive sleep apnea  .  Pathophysiology of sleep disordered breathing discussed with patient  .  Treatment options discussed with the patient .  Patient did tolerate CPAP well and slept well during the night of the study  Plan: Initiate CPAP therapy  Lunesta 2 mg nightly to help sleep quality  Patient does have some issues with tolerating the mask over her face, using Lunesta will help getting used to CPAP therapy  I will see her back in about 3 months

## 2019-10-11 NOTE — Telephone Encounter (Signed)
Patient called and stated that she was seen yesterday and Kristen Moran sent Rx's into pharmacy but it was the wrong pharmacy. Patient wants them sent to [facility] not [facility].   Rx's faxed.  Pended Crestor and sent to [facility] due to [place].

## 2019-10-12 LAB — HM COLONOSCOPY

## 2019-11-03 ENCOUNTER — Other Ambulatory Visit: Payer: Self-pay | Admitting: Internal Medicine

## 2019-11-03 DIAGNOSIS — E890 Postprocedural hypothyroidism: Secondary | ICD-10-CM

## 2019-11-03 NOTE — Telephone Encounter (Signed)
rx sent to pharmacy by e-script  

## 2019-11-24 LAB — HM DEXA SCAN: HM Dexa Scan: NORMAL

## 2019-11-25 ENCOUNTER — Encounter: Payer: Self-pay | Admitting: Nurse Practitioner

## 2019-11-25 ENCOUNTER — Telehealth: Payer: Self-pay

## 2019-11-25 NOTE — Telephone Encounter (Signed)
Called patient, no answer, and no voicemail set up.  Reason for call, per Sharon Seller, NP Bone Density normal.  I will try to reach patient again later.

## 2019-11-28 NOTE — Telephone Encounter (Signed)
Discussed results with patient, patient verbalized understanding of results  

## 2019-12-16 ENCOUNTER — Telehealth: Payer: Self-pay

## 2019-12-16 ENCOUNTER — Other Ambulatory Visit: Payer: Self-pay | Admitting: Nurse Practitioner

## 2019-12-16 DIAGNOSIS — E782 Mixed hyperlipidemia: Secondary | ICD-10-CM

## 2019-12-16 DIAGNOSIS — F32A Depression, unspecified: Secondary | ICD-10-CM

## 2019-12-16 DIAGNOSIS — E890 Postprocedural hypothyroidism: Secondary | ICD-10-CM

## 2019-12-16 DIAGNOSIS — F329 Major depressive disorder, single episode, unspecified: Secondary | ICD-10-CM

## 2019-12-16 NOTE — Telephone Encounter (Signed)
30 day supply sent because we need to follow up labs to make sure medication is working and does not need adjustment

## 2019-12-16 NOTE — Telephone Encounter (Signed)
Patient was called and voicemail was left for patient to call office back. Patient medication Rosuvastatin was sent to [facility] last but refill came from [facility]. Patient was asked to call back and state which pharmacy medication should be getting sent to.

## 2019-12-16 NOTE — Telephone Encounter (Signed)
High risk or very high risk warning populated when attempting to refill medication. RX request sent to PCP for review and approval if warranted.   

## 2019-12-20 MED ORDER — LEVOTHYROXINE SODIUM 137 MCG PO TABS: ORAL_TABLET | ORAL | 1 refills | Status: DC

## 2019-12-20 MED ORDER — ROSUVASTATIN CALCIUM 5 MG PO TABS: 5.0000 mg | ORAL_TABLET | Freq: Every day | ORAL | 1 refills | Status: DC

## 2019-12-20 MED ORDER — CITALOPRAM HYDROBROMIDE 20 MG PO TABS: 20.0000 mg | ORAL_TABLET | Freq: Every day | ORAL | 1 refills | Status: DC

## 2019-12-20 NOTE — Telephone Encounter (Signed)
Patient returned call and stated that she would like for RX's to go to [facility] for it takes too long for RX's to come from [place].  Patient states she currently needs:   Levothyroxine Celexa Crestor   RX's sent as requested.

## 2019-12-20 NOTE — Addendum Note (Signed)
Addended by: Maurice Small on: 12/20/2019 10:52 AM   Modules accepted: Orders

## 2020-01-10 ENCOUNTER — Other Ambulatory Visit: Payer: PRIVATE HEALTH INSURANCE

## 2020-01-13 ENCOUNTER — Ambulatory Visit: Payer: PRIVATE HEALTH INSURANCE | Admitting: Nurse Practitioner

## 2020-01-31 ENCOUNTER — Other Ambulatory Visit: Payer: PRIVATE HEALTH INSURANCE

## 2020-01-31 ENCOUNTER — Other Ambulatory Visit: Payer: Self-pay

## 2020-01-31 LAB — COMPLETE METABOLIC PANEL WITH GFR
AG Ratio: 1.6 (calc) (ref 1.0–2.5)
ALT: 25 U/L (ref 6–29)
AST: 24 U/L (ref 10–35)
Albumin: 3.9 g/dL (ref 3.6–5.1)
Alkaline phosphatase (APISO): 71 U/L (ref 37–153)
BUN: 17 mg/dL (ref 7–25)
CO2: 26 mmol/L (ref 20–32)
Calcium: 9.5 mg/dL (ref 8.6–10.4)
Chloride: 102 mmol/L (ref 98–110)
Creat: 0.93 mg/dL (ref 0.50–0.99)
GFR, Est African American: 76 mL/min/{1.73_m2} (ref >=60)
GFR, Est Non African American: 66 mL/min/{1.73_m2} (ref >=60)
Globulin: 2.4 g/dL (calc) (ref 1.9–3.7)
Glucose, Bld: 87 mg/dL (ref 65–99)
Potassium: 4.4 mmol/L (ref 3.5–5.3)
Sodium: 135 mmol/L (ref 135–146)
Total Bilirubin: 0.6 mg/dL (ref 0.2–1.2)
Total Protein: 6.3 g/dL (ref 6.1–8.1)

## 2020-01-31 LAB — LIPID PANEL
Cholesterol: 188 mg/dL (ref <200)
HDL: 66 mg/dL (ref >=50)
LDL Cholesterol (Calc): 105 mg/dL (calc) — ABNORMAL HIGH
Non-HDL Cholesterol (Calc): 122 mg/dL (calc) (ref <130)
Total CHOL/HDL Ratio: 2.8 (calc) (ref <5.0)
Triglycerides: 80 mg/dL (ref <150)

## 2020-02-03 ENCOUNTER — Ambulatory Visit (INDEPENDENT_AMBULATORY_CARE_PROVIDER_SITE_OTHER): Payer: PRIVATE HEALTH INSURANCE | Admitting: Nurse Practitioner

## 2020-02-03 ENCOUNTER — Encounter: Payer: Self-pay | Admitting: Nurse Practitioner

## 2020-02-03 ENCOUNTER — Other Ambulatory Visit: Payer: Self-pay

## 2020-02-03 VITALS — BP 138/90 | HR 68 | Temp 97.5°F | Ht 63.0 in | Wt 233.2 lb

## 2020-02-03 DIAGNOSIS — E559 Vitamin D deficiency, unspecified: Secondary | ICD-10-CM

## 2020-02-03 DIAGNOSIS — E782 Mixed hyperlipidemia: Secondary | ICD-10-CM | POA: Diagnosis not present

## 2020-02-03 DIAGNOSIS — E890 Postprocedural hypothyroidism: Secondary | ICD-10-CM | POA: Diagnosis not present

## 2020-02-03 DIAGNOSIS — Z6841 Body Mass Index (BMI) 40.0 and over, adult: Secondary | ICD-10-CM

## 2020-02-03 DIAGNOSIS — R7303 Prediabetes: Secondary | ICD-10-CM

## 2020-02-03 NOTE — Patient Instructions (Signed)
KEEP UP THE GOOD WORK

## 2020-02-03 NOTE — Progress Notes (Signed)
Careteam: Patient Care Team: Sharon Seller, NP as PCP - General (Nurse Practitioner) Izell Kent Narrows, MD as Referring Physician (Endocrinology)  PLACE OF SERVICE:  [facility]  Advanced Directive information    Allergies  Allergen Reactions  . Lipitor [Atorvastatin] Other (See Comments)    myalgias     Chief Complaint  Patient presents with  . Medical Management of Chronic Issues    3 month follow-up   . Medication Management    Patient not taken prescribed vit d will try OTC Vit D3      HPI: Patient is a 64 y.o. female for routine follow up  Obesity-working on weight loss.  no simple carbs, no sugar. Walking more.  Limiting bad food, watching what she is eating.   Hyperlipidemia- LDL improved to 105 - reports she was taking crestor 5 mg regularly but then started taking it every other day then stopped over a month ago.   and dietary modifications.   Anxiety- doing well on celexa, does not have huge mood swings   Hypothyroid- TSH stable on levothyroxine 137 mcg  OSA on CPAP, trying to get use to that. Is a mouth breathing and this is hard. Gets 4 hours of sleep but pulls it off because it is hard.    Review of Systems:  Review of Systems  Constitutional: Negative for chills, fever and weight loss.  HENT: Negative for tinnitus.   Respiratory: Negative for cough, sputum production and shortness of breath.   Cardiovascular: Negative for chest pain, palpitations and leg swelling.  Gastrointestinal: Negative for abdominal pain, constipation, diarrhea and heartburn.  Genitourinary: Negative for dysuria, frequency and urgency.  Musculoskeletal: Negative for back pain, falls, joint pain and myalgias.  Skin: Negative.   Neurological: Negative for dizziness and headaches.  Psychiatric/Behavioral: Negative for depression and memory loss. The patient does not have insomnia.     Past Medical History:  Diagnosis Date  . Hyperlipidemia   . Hypertension   . Thyroid  disease    Hyperthyroidism   Past Surgical History:  Procedure Laterality Date  . APPENDECTOMY  11/2017  . CESAREAN SECTION  1990    Antony Blackbird MD  . CESAREAN SECTION  1993   Antony Blackbird MD  . CHOLECYSTECTOMY  1996   Clent Ridges, MD  . EYELID LACERATION REPAIR  08/2014   Social History:   reports that she has never smoked. She has never used smokeless tobacco. She reports current alcohol use. She reports that she does not use drugs.  Family History  Problem Relation Age of Onset  . Cancer Mother        Breast  . Colitis Mother   . Osteoporosis Mother   . Diabetes Father        Type 2  . Cancer Maternal Grandmother        60  . Cancer Maternal Grandfather        38    Medications: Patient's Medications  New Prescriptions   No medications on file  Previous Medications   ASPIRIN EC 81 MG TABLET    Take 81 mg by mouth as needed.   CITALOPRAM (CELEXA) 20 MG TABLET    Take 1 tablet (20 mg total) by mouth daily. Appointment OVERDUE, 1 by mouth daily   ESZOPICLONE (LUNESTA) 2 MG TABS TABLET    Take 1 tablet (2 mg total) by mouth at bedtime as needed for sleep. Take immediately before bedtime   LEVOTHYROXINE (SYNTHROID) 137 MCG TABLET    TAKE  1 TABLET BY MOUTH EVERY DAY BEFORE BREAKFAST   ROSUVASTATIN (CRESTOR) 5 MG TABLET    Take 1 tablet (5 mg total) by mouth daily.  Modified Medications   No medications on file  Discontinued Medications   VITAMIN D, ERGOCALCIFEROL, (DRISDOL) 1.25 MG (50000 UT) CAPS CAPSULE    Take 1 capsule (50,000 Units total) by mouth every 7 (seven) days.    Physical Exam:  Vitals:   02/03/20 1428  BP: 138/90  Pulse: 68  Temp: (!) 97.5 F (36.4 C)  TempSrc: Temporal  SpO2: 99%  Weight: 233 lb 3.2 oz (105.8 kg)  Height: 5\' 3"  (1.6 m)   Body mass index is 41.31 kg/m. Wt Readings from Last 3 Encounters:  02/03/20 233 lb 3.2 oz (105.8 kg)  10/11/19 244 lb 12.8 oz (111 kg)  10/10/19 245 lb (111.1 kg)    Physical Exam Constitutional:      General: She  is not in acute distress.    Appearance: She is well-developed. She is not diaphoretic.  HENT:     Head: Normocephalic and atraumatic.  Eyes:     Conjunctiva/sclera: Conjunctivae normal.     Pupils: Pupils are equal, round, and reactive to light.  Cardiovascular:     Rate and Rhythm: Normal rate and regular rhythm.     Heart sounds: Normal heart sounds.  Pulmonary:     Effort: Pulmonary effort is normal.     Breath sounds: Normal breath sounds.  Abdominal:     General: Bowel sounds are normal.     Palpations: Abdomen is soft.  Musculoskeletal:        General: No tenderness.     Cervical back: Normal range of motion and neck supple.  Skin:    General: Skin is warm and dry.  Neurological:     Mental Status: She is alert and oriented to person, place, and time.  Psychiatric:        Mood and Affect: Mood normal.        Behavior: Behavior normal.     Labs reviewed: Basic Metabolic Panel: Recent Labs    09/05/19 0000 01/31/20 0909  NA 139 135  K 4.6 4.4  CL 102 102  CO2 26* 26  GLUCOSE  --  87  BUN 13 17  CREATININE 1.0 0.93  CALCIUM 9.9 9.5  TSH 1.51  --    Liver Function Tests: Recent Labs    09/05/19 0000 01/31/20 0909  AST 24 24  ALT 25 25  ALKPHOS 89  --   BILITOT  --  0.6  PROT  --  6.3  ALBUMIN 4.6  --    No results for input(s): LIPASE, AMYLASE in the last 8760 hours. No results for input(s): AMMONIA in the last 8760 hours. CBC: Recent Labs    09/05/19 0000  WBC 5.6  HGB 13.4  HCT 40  PLT 203   Lipid Panel: Recent Labs    09/05/19 0000 01/31/20 0909  CHOL 253* 188  HDL 88* 66  LDLCALC 140 105*  TRIG 123 80  CHOLHDL  --  2.8   TSH: Recent Labs    09/05/19 0000  TSH 1.51   A1C: Lab Results  Component Value Date   HGBA1C 5.8 09/05/2019     Assessment/Plan 1. Mixed hyperlipidemia Did not tolerate crestor daily so went to every other day but then stopped. Has not had medication in over a month but working on dietary  modifications and LDL improved to 105. Will continue  to have her work on diet modifications at this time - COMPLETE METABOLIC PANEL WITH GFR; Future - CBC with Differential/Platelet; Future - Lipid panel; Future  2. Postablative hypothyroidism -TSH stable on last labs, continues on synthroid 137 mcg - TSH; Future  3. Vitamin D deficiency -to continue on daily supplement   4. Class 3 severe obesity due to excess calories with serious comorbidity and body mass index (BMI) of 40.0 to 44.9 in adult [facility]) -encouraged weight loss through routine exercise and dietary modifications for better health outcomes.   5. Prediabetes -recent fasting glucose has improved, to continue to work on diet modifications with increase physical activity - COMPLETE METABOLIC PANEL WITH GFR; Future - CBC with Differential/Platelet; Future  Next appt: 6 months with labs prior.  Janene Harvey. Biagio Borg  [facility] [phone]

## 2020-07-30 ENCOUNTER — Other Ambulatory Visit: Payer: PRIVATE HEALTH INSURANCE

## 2020-07-31 ENCOUNTER — Other Ambulatory Visit: Payer: Self-pay | Admitting: Nurse Practitioner

## 2020-07-31 DIAGNOSIS — F32A Depression, unspecified: Secondary | ICD-10-CM

## 2020-07-31 DIAGNOSIS — E890 Postprocedural hypothyroidism: Secondary | ICD-10-CM

## 2020-08-03 ENCOUNTER — Ambulatory Visit: Payer: PRIVATE HEALTH INSURANCE | Admitting: Nurse Practitioner

## 2020-08-06 ENCOUNTER — Other Ambulatory Visit: Payer: PRIVATE HEALTH INSURANCE

## 2020-08-08 ENCOUNTER — Ambulatory Visit: Payer: PRIVATE HEALTH INSURANCE | Admitting: Nurse Practitioner

## 2020-08-10 LAB — HM MAMMOGRAPHY

## 2020-08-13 ENCOUNTER — Other Ambulatory Visit: Payer: Self-pay

## 2020-08-13 ENCOUNTER — Other Ambulatory Visit: Payer: PRIVATE HEALTH INSURANCE

## 2020-08-14 LAB — LIPID PANEL
Cholesterol: 228 mg/dL — ABNORMAL HIGH (ref <200)
HDL: 75 mg/dL (ref >=50)
LDL Cholesterol (Calc): 127 mg/dL (calc) — ABNORMAL HIGH
Non-HDL Cholesterol (Calc): 153 mg/dL (calc) — ABNORMAL HIGH (ref <130)
Total CHOL/HDL Ratio: 3 (calc) (ref <5.0)
Triglycerides: 143 mg/dL (ref <150)

## 2020-08-14 LAB — CBC WITH DIFFERENTIAL/PLATELET
Absolute Monocytes: 447 cells/uL (ref 200–950)
Basophils Absolute: 52 cells/uL (ref 0–200)
Basophils Relative: 0.9 %
Eosinophils Absolute: 174 cells/uL (ref 15–500)
Eosinophils Relative: 3 %
HCT: 39.8 % (ref 35.0–45.0)
Hemoglobin: 13.1 g/dL (ref 11.7–15.5)
Lymphs Abs: 2564 cells/uL (ref 850–3900)
MCH: 29 pg (ref 27.0–33.0)
MCHC: 32.9 g/dL (ref 32.0–36.0)
MCV: 88.1 fL (ref 80.0–100.0)
MPV: 11 fL (ref 7.5–12.5)
Monocytes Relative: 7.7 %
Neutro Abs: 2564 cells/uL (ref 1500–7800)
Neutrophils Relative %: 44.2 %
Platelets: 213 10*3/uL (ref 140–400)
RBC: 4.52 10*6/uL (ref 3.80–5.10)
RDW: 12.4 % (ref 11.0–15.0)
Total Lymphocyte: 44.2 %
WBC: 5.8 10*3/uL (ref 3.8–10.8)

## 2020-08-14 LAB — COMPLETE METABOLIC PANEL WITH GFR
AG Ratio: 1.6 (calc) (ref 1.0–2.5)
ALT: 21 U/L (ref 6–29)
AST: 21 U/L (ref 10–35)
Albumin: 3.9 g/dL (ref 3.6–5.1)
Alkaline phosphatase (APISO): 76 U/L (ref 37–153)
BUN: 13 mg/dL (ref 7–25)
CO2: 27 mmol/L (ref 20–32)
Calcium: 9.3 mg/dL (ref 8.6–10.4)
Chloride: 105 mmol/L (ref 98–110)
Creat: 0.95 mg/dL (ref 0.50–0.99)
GFR, Est African American: 74 mL/min/{1.73_m2} (ref >=60)
GFR, Est Non African American: 64 mL/min/{1.73_m2} (ref >=60)
Globulin: 2.4 g/dL (calc) (ref 1.9–3.7)
Glucose, Bld: 91 mg/dL (ref 65–99)
Potassium: 4.3 mmol/L (ref 3.5–5.3)
Sodium: 139 mmol/L (ref 135–146)
Total Bilirubin: 0.7 mg/dL (ref 0.2–1.2)
Total Protein: 6.3 g/dL (ref 6.1–8.1)

## 2020-08-14 LAB — TSH: TSH: 0.83 mIU/L (ref 0.40–4.50)

## 2020-08-15 ENCOUNTER — Ambulatory Visit: Payer: PRIVATE HEALTH INSURANCE | Admitting: Nurse Practitioner

## 2020-08-30 ENCOUNTER — Other Ambulatory Visit: Payer: Self-pay | Admitting: Nurse Practitioner

## 2020-08-30 DIAGNOSIS — E890 Postprocedural hypothyroidism: Secondary | ICD-10-CM

## 2020-08-30 DIAGNOSIS — F32A Depression, unspecified: Secondary | ICD-10-CM

## 2020-08-31 ENCOUNTER — Other Ambulatory Visit: Payer: Self-pay

## 2020-08-31 ENCOUNTER — Ambulatory Visit (INDEPENDENT_AMBULATORY_CARE_PROVIDER_SITE_OTHER): Payer: PRIVATE HEALTH INSURANCE | Admitting: Nurse Practitioner

## 2020-08-31 ENCOUNTER — Encounter: Payer: Self-pay | Admitting: Nurse Practitioner

## 2020-08-31 VITALS — BP 140/90 | HR 73 | Temp 97.4°F | Ht 63.0 in | Wt 241.8 lb

## 2020-08-31 DIAGNOSIS — Z114 Encounter for screening for human immunodeficiency virus [HIV]: Secondary | ICD-10-CM

## 2020-08-31 DIAGNOSIS — F3342 Major depressive disorder, recurrent, in full remission: Secondary | ICD-10-CM

## 2020-08-31 DIAGNOSIS — E782 Mixed hyperlipidemia: Secondary | ICD-10-CM

## 2020-08-31 DIAGNOSIS — E890 Postprocedural hypothyroidism: Secondary | ICD-10-CM | POA: Diagnosis not present

## 2020-08-31 DIAGNOSIS — Z1159 Encounter for screening for other viral diseases: Secondary | ICD-10-CM | POA: Diagnosis not present

## 2020-08-31 DIAGNOSIS — E66813 Obesity, class 3: Secondary | ICD-10-CM

## 2020-08-31 DIAGNOSIS — Z6841 Body Mass Index (BMI) 40.0 and over, adult: Secondary | ICD-10-CM

## 2020-08-31 DIAGNOSIS — Z8249 Family history of ischemic heart disease and other diseases of the circulatory system: Secondary | ICD-10-CM

## 2020-08-31 MED ORDER — LEVOTHYROXINE SODIUM 137 MCG PO TABS: ORAL_TABLET | ORAL | 1 refills | Status: DC

## 2020-08-31 NOTE — Patient Instructions (Signed)
Follow up in 4 months with labs prior to visit.   DASH Eating Plan DASH stands for "Dietary Approaches to Stop Hypertension." The DASH eating plan is a healthy eating plan that has been shown to reduce high blood pressure (hypertension). It may also reduce your risk for type 2 diabetes, heart disease, and stroke. The DASH eating plan may also help with weight loss. What are tips for following this plan?  General guidelines  Avoid eating more than 2,300 mg (milligrams) of salt (sodium) a day. If you have hypertension, you may need to reduce your sodium intake to 1,500 mg a day.  Limit alcohol intake to no more than 1 drink a day for nonpregnant women and 2 drinks a day for men. One drink equals 12 oz of beer, 5 oz of wine, or 1 oz of hard liquor.  Work with your health care provider to maintain a healthy body weight or to lose weight. Ask what an ideal weight is for you.  Get at least 30 minutes of exercise that causes your heart to beat faster (aerobic exercise) most days of the week. Activities may include walking, swimming, or biking.  Work with your health care provider or diet and nutrition specialist (dietitian) to adjust your eating plan to your individual calorie needs. Reading food labels   Check food labels for the amount of sodium per serving. Choose foods with less than 5 percent of the Daily Value of sodium. Generally, foods with less than 300 mg of sodium per serving fit into this eating plan.  To find whole grains, look for the word "whole" as the first word in the ingredient list. Shopping  Buy products labeled as "low-sodium" or "no salt added."  Buy fresh foods. Avoid canned foods and premade or frozen meals. Cooking  Avoid adding salt when cooking. Use salt-free seasonings or herbs instead of table salt or sea salt. Check with your health care provider or pharmacist before using salt substitutes.  Do not fry foods. Cook foods using healthy methods such as baking,  boiling, grilling, and broiling instead.  Cook with heart-healthy oils, such as olive, canola, soybean, or sunflower oil. Meal planning  Eat a balanced diet that includes: ? 5 or more servings of fruits and vegetables each day. At each meal, try to fill half of your plate with fruits and vegetables. ? Up to 6-8 servings of whole grains each day. ? Less than 6 oz of lean meat, poultry, or fish each day. A 3-oz serving of meat is about the same size as a deck of cards. One egg equals 1 oz. ? 2 servings of low-fat dairy each day. ? A serving of nuts, seeds, or beans 5 times each week. ? Heart-healthy fats. Healthy fats called Omega-3 fatty acids are found in foods such as flaxseeds and coldwater fish, like sardines, salmon, and mackerel.  Limit how much you eat of the following: ? Canned or prepackaged foods. ? Food that is high in trans fat, such as fried foods. ? Food that is high in saturated fat, such as fatty meat. ? Sweets, desserts, sugary drinks, and other foods with added sugar. ? Full-fat dairy products.  Do not salt foods before eating.  Try to eat at least 2 vegetarian meals each week.  Eat more home-cooked food and less restaurant, buffet, and fast food.  When eating at a restaurant, ask that your food be prepared with less salt or no salt, if possible. What foods are recommended? The items  listed may not be a complete list. Talk with your dietitian about what dietary choices are best for you. Grains Whole-grain or whole-wheat bread. Whole-grain or whole-wheat pasta. Brown rice. Modena Morrow. Bulgur. Whole-grain and low-sodium cereals. Pita bread. Low-fat, low-sodium crackers. Whole-wheat flour tortillas. Vegetables Fresh or frozen vegetables (raw, steamed, roasted, or grilled). Low-sodium or reduced-sodium tomato and vegetable juice. Low-sodium or reduced-sodium tomato sauce and tomato paste. Low-sodium or reduced-sodium canned vegetables. Fruits All fresh, dried, or  frozen fruit. Canned fruit in natural juice (without added sugar). Meat and other protein foods Skinless chicken or [place]. Ground chicken or [place]. Pork with fat trimmed off. Fish and seafood. Egg whites. Dried beans, peas, or lentils. Unsalted nuts, nut butters, and seeds. Unsalted canned beans. Lean cuts of beef with fat trimmed off. Low-sodium, lean deli meat. Dairy Low-fat (1%) or fat-free (skim) milk. Fat-free, low-fat, or reduced-fat cheeses. Nonfat, low-sodium ricotta or cottage cheese. Low-fat or nonfat yogurt. Low-fat, low-sodium cheese. Fats and oils Soft margarine without trans fats. Vegetable oil. Low-fat, reduced-fat, or light mayonnaise and salad dressings (reduced-sodium). Canola, safflower, olive, soybean, and sunflower oils. Avocado. Seasoning and other foods Herbs. Spices. Seasoning mixes without salt. Unsalted popcorn and pretzels. Fat-free sweets. What foods are not recommended? The items listed may not be a complete list. Talk with your dietitian about what dietary choices are best for you. Grains Baked goods made with fat, such as croissants, muffins, or some breads. Dry pasta or rice meal packs. Vegetables Creamed or fried vegetables. Vegetables in a cheese sauce. Regular canned vegetables (not low-sodium or reduced-sodium). Regular canned tomato sauce and paste (not low-sodium or reduced-sodium). Regular tomato and vegetable juice (not low-sodium or reduced-sodium). Angie Fava. Olives. Fruits Canned fruit in a light or heavy syrup. Fried fruit. Fruit in cream or butter sauce. Meat and other protein foods Fatty cuts of meat. Ribs. Fried meat. Berniece Salines. Sausage. Bologna and other processed lunch meats. Salami. Fatback. Hotdogs. Bratwurst. Salted nuts and seeds. Canned beans with added salt. Canned or smoked fish. Whole eggs or egg yolks. Chicken or [place] with skin. Dairy Whole or 2% milk, cream, and half-and-half. Whole or full-fat cream cheese. Whole-fat or sweetened yogurt.  Full-fat cheese. Nondairy creamers. Whipped toppings. Processed cheese and cheese spreads. Fats and oils Butter. Stick margarine. Lard. Shortening. Ghee. Bacon fat. Tropical oils, such as coconut, palm kernel, or palm oil. Seasoning and other foods Salted popcorn and pretzels. Onion salt, garlic salt, seasoned salt, table salt, and sea salt. Worcestershire sauce. Tartar sauce. Barbecue sauce. Teriyaki sauce. Soy sauce, including reduced-sodium. Steak sauce. Canned and packaged gravies. Fish sauce. Oyster sauce. Cocktail sauce. Horseradish that you find on the shelf. Ketchup. Mustard. Meat flavorings and tenderizers. Bouillon cubes. Hot sauce and Tabasco sauce. Premade or packaged marinades. Premade or packaged taco seasonings. Relishes. Regular salad dressings. Where to find more information:  National Heart, Lung, and [place]: https://wilson-eaton.com/  American Heart Association: www.heart.org Summary  The DASH eating plan is a healthy eating plan that has been shown to reduce high blood pressure (hypertension). It may also reduce your risk for type 2 diabetes, heart disease, and stroke.  With the DASH eating plan, you should limit salt (sodium) intake to 2,300 mg a day. If you have hypertension, you may need to reduce your sodium intake to 1,500 mg a day.  When on the DASH eating plan, aim to eat more fresh fruits and vegetables, whole grains, lean proteins, low-fat dairy, and heart-healthy fats.  Work with your health care provider or  diet and nutrition specialist (dietitian) to adjust your eating plan to your individual calorie needs. This information is not intended to replace advice given to you by your health care provider. Make sure you discuss any questions you have with your health care provider. Document Revised: 09/18/2017 Document Reviewed: 09/29/2016 Elsevier Patient Education  2020 [employer].

## 2020-08-31 NOTE — Progress Notes (Signed)
Careteam: Patient Care Team: Lauree Chandler, NP as PCP - General (Nurse Practitioner) Amalia Greenhouse, MD as Referring Physician (Endocrinology)  PLACE OF SERVICE:  [place] Directive information Does Patient Have a Medical Advance Directive?: No, Would patient like information on creating a medical advance directive?: No - Patient declined  Allergies  Allergen Reactions  . Lipitor [Atorvastatin] Other (See Comments)    myalgias     Chief Complaint  Patient presents with  . Medical Management of Chronic Issues    6 month follow up visit Patient has no concerns at this visit.  Marland Kitchen Best Practice Recommendations    Hep C screening, HIV screening,      HPI: Patient is a 64 y.o. female for follow up  Very stressed at work, living alone. Has been walking every day.  Eating a lot more sugar and bread Not drinking ETOH or smoking.   Has a nutritionist that she knows and can talk to  Pt with family hx of cardiovascular disease. Dad died from MI and brother had a heart attack at 69.   Hyperlipidemia- on crestor 5 mg daily but was not taking routinely but since blood work has been taking routinely. No side effects with taking crestor.   Blood pressure elevated today  Anxiety/depression- controlled on celexa. Can tell when she has missed a dose. More tearful/stressed.   Review of Systems:  Review of Systems  Constitutional: Negative for chills, fever and weight loss.  HENT: Negative for tinnitus.   Respiratory: Negative for cough, sputum production and shortness of breath.   Cardiovascular: Negative for chest pain, palpitations and leg swelling.  Gastrointestinal: Negative for abdominal pain, constipation, diarrhea and heartburn.  Genitourinary: Negative for dysuria, frequency and urgency.  Musculoskeletal: Negative for back pain, falls, joint pain and myalgias.  Skin: Negative.   Neurological: Negative for dizziness and headaches.  Psychiatric/Behavioral:  Negative for depression and memory loss. The patient does not have insomnia.     Past Medical History:  Diagnosis Date  . Hyperlipidemia   . Hypertension   . Thyroid disease    Hyperthyroidism   Past Surgical History:  Procedure Laterality Date  . APPENDECTOMY  11/2017  . [place], MD  . [place]  08/2014   Social History:   reports that she has never smoked. She has never used smokeless tobacco. She reports current alcohol use. She reports that she does not use drugs.  Family History  Problem Relation Age of Onset  . Cancer Mother        Breast  . Colitis Mother   . Osteoporosis Mother   . Diabetes Father        Type 2  . Cancer Maternal Grandmother        35  . Cancer Maternal Grandfather        50    Medications: Patient's Medications  New Prescriptions   No medications on file  Previous Medications   CITALOPRAM (CELEXA) 20 MG TABLET    TAKE 1 TABLET BY MOUTH DAILY   LEVOTHYROXINE (SYNTHROID) 137 MCG TABLET    TAKE 1 TABLET BY MOUTH EVERY DAY BEFORE BREAKFAST   ROSUVASTATIN (CRESTOR) 5 MG TABLET    Take 5 mg by mouth daily.  Modified Medications   No medications on file  Discontinued Medications   ASPIRIN  EC 81 MG TABLET    Take 81 mg by mouth as needed.    Physical Exam:  Vitals:   08/31/20 1512  BP: 140/90  Pulse: 73  Temp: (!) 97.4 F (36.3 C)  TempSrc: Temporal  SpO2: 97%  Weight: 241 lb 12.8 oz (109.7 kg)  Height: '5\' 3"'  (1.6 m)   Body mass index is 42.83 kg/m. Wt Readings from Last 3 Encounters:  08/31/20 241 lb 12.8 oz (109.7 kg)  02/03/20 233 lb 3.2 oz (105.8 kg)  10/11/19 244 lb 12.8 oz (111 kg)    Physical Exam Constitutional:      General: She is not in acute distress.    Appearance: She is well-developed. She is not diaphoretic.  HENT:     Head: Normocephalic and atraumatic.     Mouth/Throat:     Pharynx: No  oropharyngeal exudate.  Eyes:     Conjunctiva/sclera: Conjunctivae normal.     Pupils: Pupils are equal, round, and reactive to light.  Cardiovascular:     Rate and Rhythm: Normal rate and regular rhythm.     Heart sounds: Normal heart sounds.  Pulmonary:     Effort: Pulmonary effort is normal.     Breath sounds: Normal breath sounds.  Abdominal:     General: Bowel sounds are normal.     Palpations: Abdomen is soft.  Musculoskeletal:        General: No tenderness.     Cervical back: Normal range of motion and neck supple.  Skin:    General: Skin is warm and dry.  Neurological:     Mental Status: She is alert and oriented to person, place, and time.     Labs reviewed: Basic Metabolic Panel: Recent Labs    09/05/19 0000 01/31/20 0909 08/13/20 0913  NA 139 135 139  K 4.6 4.4 4.3  CL 102 102 105  CO2 26* 26 27  GLUCOSE  --  87 91  BUN '13 17 13  ' CREATININE 1.0 0.93 0.95  CALCIUM 9.9 9.5 9.3  TSH 1.51  --  0.83   Liver Function Tests: Recent Labs    09/05/19 0000 01/31/20 0909 08/13/20 0913  AST '24 24 21  ' ALT '25 25 21  ' ALKPHOS 89  --   --   BILITOT  --  0.6 0.7  PROT  --  6.3 6.3  ALBUMIN 4.6  --   --    No results for input(s): LIPASE, AMYLASE in the last 8760 hours. No results for input(s): AMMONIA in the last 8760 hours. CBC: Recent Labs    09/05/19 0000 08/13/20 0913  WBC 5.6 5.8  NEUTROABS  --  2,564  HGB 13.4 13.1  HCT 40 39.8  MCV  --  88.1  PLT 203 213   Lipid Panel: Recent Labs    09/05/19 0000 01/31/20 0909 08/13/20 0913  CHOL 253* 188 228*  HDL 88* 66 75  LDLCALC 140 105* 127*  TRIG 123 80 143  CHOLHDL  --  2.8 3.0   TSH: Recent Labs    09/05/19 0000 08/13/20 0913  TSH 1.51 0.83   A1C: Lab Results  Component Value Date   HGBA1C 5.8 09/05/2019     Assessment/Plan 1. Postablative hypothyroidism TSH at goal - levothyroxine (SYNTHROID) 137 MCG tablet; Take one tablet daily  Dispense: 90 tablet; Refill: 1  2. Need for  hepatitis C screening test - Hepatitis C antibody; Future  3. Encounter for screening for HIV - HIV Antibody (routine testing  w rflx); Future  4. Mixed hyperlipidemia -LDL not at goal. Started taking crestor since lab work came back. Also encouraged dietary modifications.  Will follow up in 4 months with labs prior.  - Lipid Panel; Future - CMP with eGFR([facility]); Future  5. Class 3 severe obesity due to excess calories with serious comorbidity and body mass index (BMI) of 40.0 to 44.9 in adult [facility]) Strongly encouraged weight loss, discussed most of visit with ways for weight loss with dietary modifications and increase in physical activity/exercise. She has nutritionist she plans to talk to for healthy weight reduction.   6. Depression, unspecified depression type Controlled on celexa, encouraged exercise as well to help with depression.   7. Family history of premature CAD Discussed importance of proper control of blood pressure, cholesterol, weight reduction. Pt does not smoke or drink ETOH.   8. Elevated blood pressure reading -goal bp <140/90. Dash diet given as well as other education for lifestyle modifications for reduction in BP.  To recheck at home and to notify if bp remaining elevated.    Next appt: 4 months with labs prior.  Carlos American. [place] [place] [facility] [phone]

## 2020-12-02 ENCOUNTER — Other Ambulatory Visit: Payer: Self-pay | Admitting: Nurse Practitioner

## 2020-12-02 DIAGNOSIS — E890 Postprocedural hypothyroidism: Secondary | ICD-10-CM

## 2020-12-02 DIAGNOSIS — F32A Depression, unspecified: Secondary | ICD-10-CM

## 2020-12-03 ENCOUNTER — Encounter: Payer: Self-pay | Admitting: Family

## 2020-12-03 ENCOUNTER — Ambulatory Visit (INDEPENDENT_AMBULATORY_CARE_PROVIDER_SITE_OTHER): Payer: PRIVATE HEALTH INSURANCE | Admitting: Family

## 2020-12-03 ENCOUNTER — Other Ambulatory Visit: Payer: Self-pay

## 2020-12-03 VITALS — BP 118/68 | HR 95 | Temp 98.1°F | Resp 18 | Ht 63.0 in | Wt 234.4 lb

## 2020-12-03 DIAGNOSIS — B029 Zoster without complications: Secondary | ICD-10-CM

## 2020-12-03 MED ORDER — VALACYCLOVIR HCL 1 G PO TABS: 1000.0000 mg | ORAL_TABLET | Freq: Two times a day (BID) | ORAL | 0 refills | Status: AC

## 2020-12-03 NOTE — Progress Notes (Addendum)
Provider: Cherry Turlington FNP-C  Sharon Seller, NP  Patient Care Team: Sharon Seller, NP as PCP - General (Nurse Practitioner) Izell , MD as Referring Physician (Endocrinology)  Extended Emergency Contact Information Primary Emergency Contact: [facility] Address: [address]          [place], [place] [address] [place] Phone: [phone] Relation: Spouse Secondary Emergency Contact: Wilson,Carolyn  [place] Home Phone: [phone] Mobile Phone: [phone] Relation: Mother  Code Status:  Full Code  Goals of care: Advanced Directive information Advanced Directives 12/03/2020  Does Patient Have a Medical Advance Directive? No  Type of Advance Directive -  Does patient want to make changes to medical advance directive? -  Copy of Healthcare Power of Attorney in Chart? -  Would patient like information on creating a medical advance directive? No - Patient declined     Chief Complaint  Patient presents with  . Acute Visit    Possible Shingles in groin area and back hip and buttocks     HPI:  Pt is a 65 y.o. female seen today for an acute visit for evaluation of rash. Headache on Monday last week.on wednesday had rash and felt numbness.Rash has started from the right gluteal and has spread to thigh area and peri-area. Has had chills but no fever. Has burning sensation.Took Tylenol for pain.  Has not had her shingles vaccine " PCP Shanda Bumps told me to get the vaccine but I didn't ".    Past Medical History:  Diagnosis Date  . Hyperlipidemia   . Hypertension   . Thyroid disease    Hyperthyroidism   Past Surgical History:  Procedure Laterality Date  . APPENDECTOMY  11/2017  . CESAREAN SECTION  1990    Antony Blackbird MD  . CESAREAN SECTION  1993   Antony Blackbird MD  . CHOLECYSTECTOMY  1996   Clent Ridges, MD  . EYELID LACERATION REPAIR  08/2014    Allergies  Allergen Reactions  . Lipitor [Atorvastatin] Other (See Comments)     myalgias     Outpatient Encounter Medications as of 12/03/2020  Medication Sig  . citalopram (CELEXA) 20 MG tablet TAKE 1 TABLET BY MOUTH DAILY  . levothyroxine (SYNTHROID) 137 MCG tablet Take one tablet daily  . rosuvastatin (CRESTOR) 5 MG tablet Take 5 mg by mouth daily.   No facility-administered encounter medications on file as of 12/03/2020.    Review of Systems  Constitutional: Negative for appetite change, chills, fatigue and fever.  Respiratory: Negative for cough, chest tightness, shortness of breath and wheezing.   Cardiovascular: Negative for chest pain, palpitations and leg swelling.  Gastrointestinal: Negative for abdominal distention, abdominal pain, diarrhea, nausea and vomiting.  Skin: Positive for rash. Negative for color change, pallor and wound.  Neurological: Negative for dizziness, speech difficulty, weakness, light-headedness, numbness and headaches.  Hematological: Does not bruise/bleed easily.  Psychiatric/Behavioral: Negative for agitation, confusion and sleep disturbance. The patient is not nervous/anxious.     Immunization History  Administered Date(s) Administered  . Influenza,inj,Quad PF,6+ Mos 07/06/2019  . Influenza-Unspecified 08/01/2020  . Moderna Sars-Covid-2 Vaccination 12/21/2019, 01/20/2020  . Tdap 10/20/2014   Pertinent  Health Maintenance Due  Topic Date Due  . COLONOSCOPY (Pts 45-65yrs Insurance coverage will need to be confirmed)  10/20/2020  . MAMMOGRAM  08/10/2022  . PAP SMEAR-Modifier  09/04/2022  . INFLUENZA VACCINE  Completed   Fall Risk  12/03/2020 08/31/2020 02/03/2020 10/10/2019 07/20/2018  Falls in the past year? 1 0 0 0  No  Number falls in past yr: 0 0 0 0 -  Injury with Fall? 0 0 0 0 -  Comment - - - - -   Functional Status Survey:    Vitals:   12/03/20 1256  BP: 118/68  Pulse: 95  Resp: 18  Temp: 98.1 F (36.7 C)  TempSrc: Temporal  SpO2: 100%  Weight: 234 lb 6.4 oz (106.3 kg)  Height: 5\' 3"  (1.6 m)   Body  mass index is 41.52 kg/m. Physical Exam Vitals reviewed.  Constitutional:      General: She is not in acute distress.    Appearance: She is obese. She is not ill-appearing.  HENT:     Head: Normocephalic.  Eyes:     General: No scleral icterus.       Right eye: No discharge.        Left eye: No discharge.     Conjunctiva/sclera: Conjunctivae normal.     Pupils: Pupils are equal, round, and reactive to light.  Cardiovascular:     Rate and Rhythm: Normal rate and regular rhythm.     Pulses: Normal pulses.     Heart sounds: Normal heart sounds. No murmur heard. No friction rub. No gallop.   Pulmonary:     Effort: Pulmonary effort is normal. No respiratory distress.     Breath sounds: Normal breath sounds. No wheezing, rhonchi or rales.  Chest:     Chest wall: No tenderness.  Skin:    General: Skin is warm and dry.     Coloration: Skin is not pale.     Findings: Rash present. No bruising.          Comments: Multiple clusters of vesicular rash on red base noted right gluteal and anterior thigh area up towards right groin area.No rash on peri-area.   Neurological:     Mental Status: She is alert and oriented to person, place, and time.     Motor: No weakness.     Gait: Gait normal.  Psychiatric:        Mood and Affect: Mood normal.        Behavior: Behavior normal.        Thought Content: Thought content normal.        Judgment: Judgment normal.     Labs reviewed: Recent Labs    01/31/20 0909 08/13/20 0913  NA 135 139  K 4.4 4.3  CL 102 105  CO2 26 27  GLUCOSE 87 91  BUN 17 13  CREATININE 0.93 0.95  CALCIUM 9.5 9.3   Recent Labs    01/31/20 0909 08/13/20 0913  AST 24 21  ALT 25 21  BILITOT 0.6 0.7  PROT 6.3 6.3   Recent Labs    08/13/20 0913  WBC 5.8  NEUTROABS 2,564  HGB 13.1  HCT 39.8  MCV 88.1  PLT 213   Lab Results  Component Value Date   TSH 0.83 08/13/2020   Lab Results  Component Value Date   HGBA1C 5.8 09/05/2019   Lab Results   Component Value Date   CHOL 228 (H) 08/13/2020   HDL 75 08/13/2020   LDLCALC 127 (H) 08/13/2020   TRIG 143 08/13/2020   CHOLHDL 3.0 08/13/2020    Significant Diagnostic Results in last 30 days:  No results found.  Assessment/Plan  Herpes zoster without complication Afebrile. Multiple clusters of vesicular rash on red base noted right gluteal and anterior thigh area up towards right groin area.No rash on peri-area. -  Advised to apply calamine or Aveeno anti-itchy cream as needed for itchy. - may apply ice compressor to relief  Itchy - will treat will Valacyclovir 1 gm twice daily x 7 days.  - valACYclovir (VALTREX) 1000 MG tablet; Take 1 tablet (1,000 mg total) by mouth 2 (two) times daily for 7 days.  Dispense: 14 tablet; Refill: 0  Family/ staff Communication: Reviewed plan of care with patient verbalized understanding.   Labs/tests ordered: None   Next Appointment: 1 week for follow up shingles.   Caesar Bookman, NP

## 2020-12-03 NOTE — Patient Instructions (Signed)
Shingles  Shingles is an infection. It gives you a painful skin rash and blisters that have fluid in them. Shingles is caused by the same germ (virus) that causes chickenpox. Shingles only happens in people who:  Have had chickenpox.  Have been given a shot of medicine (vaccine) to protect against chickenpox. Shingles is rare in this group. The first symptoms of shingles may be itching, tingling, or pain in an area on your skin. A rash will show on your skin a few days or weeks later. The rash is likely to be on one side of your body. The rash usually has a shape like a belt or a band. Over time, the rash turns into fluid-filled blisters. The blisters will break open, change into scabs, and dry up. Medicines may:  Help with pain and itching.  Help you get better sooner.  Help to prevent long-term problems. Follow these instructions at home: Medicines  Take over-the-counter and prescription medicines only as told by your doctor.  Put on an anti-itch cream or numbing cream where you have a rash, blisters, or scabs. Do this as told by your doctor. Helping with itching and discomfort  Put cold, wet cloths (cold compresses) on the area of the rash or blisters as told by your doctor.  Cool baths can help you feel better. Try adding baking soda or dry oatmeal to the water to lessen itching. Do not bathe in hot water.   Blister and rash care  Keep your rash covered with a loose bandage (dressing).  Wear loose clothing that does not rub on your rash.  Keep your rash and blisters clean. To do this, wash the area with mild soap and cool water as told by your doctor.  Check your rash every day for signs of infection. Check for: ? More redness, swelling, or pain. ? Fluid or blood. ? Warmth. ? Pus or a bad smell.  Do not scratch your rash. Do not pick at your blisters. To help you to not scratch: ? Keep your fingernails clean and cut short. ? Wear gloves or mittens when you sleep, if  scratching is a problem. General instructions  Rest as told by your doctor.  Keep all follow-up visits as told by your doctor. This is important.  Wash your hands often with soap and water. If soap and water are not available, use hand sanitizer. Doing this lowers your chance of getting a skin infection caused by germs (bacteria).  Your infection can cause chickenpox in people who have never had chickenpox or never got a shot of chickenpox vaccine. If you have blisters that did not change into scabs yet, try not to touch other people or be around other people, especially: ? Babies. ? Pregnant women. ? Children who have areas of red, itchy, or rough skin (eczema). ? Very old people who have transplants. ? People who have a long-term (chronic) sickness, like cancer or AIDS. Contact a doctor if:  Your pain does not get better with medicine.  Your pain does not get better after the rash heals.  You have any signs of infection in the rash area. These signs include: ? More redness, swelling, or pain around the rash. ? Fluid or blood coming from the rash. ? The rash area feeling warm to the touch. ? Pus or a bad smell coming from the rash. Get help right away if:  The rash is on your face or nose.  You have pain in your face or pain   by your eye.  You lose feeling on one side of your face.  You have trouble seeing.  You have ear pain, or you have ringing in your ear.  You have a loss of taste.  Your condition gets worse. Summary  Shingles gives you a painful skin rash and blisters that have fluid in them.  Shingles is an infection. It is caused by the same germ (virus) that causes chickenpox.  Keep your rash covered with a loose bandage (dressing). Wear loose clothing that does not rub on your rash.  If you have blisters that did not change into scabs yet, try not to touch other people or be around people. This information is not intended to replace advice given to you by  your health care provider. Make sure you discuss any questions you have with your health care provider. Document Revised: 01/28/2019 Document Reviewed: 06/10/2017 Elsevier Patient Education  2021 Elsevier Inc.  

## 2020-12-31 ENCOUNTER — Other Ambulatory Visit: Payer: PRIVATE HEALTH INSURANCE

## 2021-01-01 ENCOUNTER — Other Ambulatory Visit: Payer: PRIVATE HEALTH INSURANCE

## 2021-01-04 ENCOUNTER — Ambulatory Visit: Payer: PRIVATE HEALTH INSURANCE | Admitting: Nurse Practitioner

## 2021-01-11 ENCOUNTER — Other Ambulatory Visit: Payer: PRIVATE HEALTH INSURANCE

## 2021-01-14 ENCOUNTER — Ambulatory Visit: Payer: PRIVATE HEALTH INSURANCE | Admitting: Nurse Practitioner

## 2021-03-21 ENCOUNTER — Other Ambulatory Visit: Payer: Self-pay | Admitting: Nurse Practitioner

## 2021-03-21 DIAGNOSIS — F32A Depression, unspecified: Secondary | ICD-10-CM

## 2021-06-22 ENCOUNTER — Other Ambulatory Visit: Payer: Self-pay | Admitting: Nurse Practitioner

## 2021-06-22 DIAGNOSIS — F32A Depression, unspecified: Secondary | ICD-10-CM

## 2021-08-01 ENCOUNTER — Other Ambulatory Visit: Payer: Self-pay | Admitting: Nurse Practitioner

## 2021-08-01 DIAGNOSIS — E890 Postprocedural hypothyroidism: Secondary | ICD-10-CM

## 2021-08-01 NOTE — Telephone Encounter (Signed)
Pharmacy requested refill.  Patient needs an appointment before anymore future refills.  

## 2021-08-04 ENCOUNTER — Other Ambulatory Visit: Payer: Self-pay | Admitting: Nurse Practitioner

## 2021-08-04 DIAGNOSIS — E890 Postprocedural hypothyroidism: Secondary | ICD-10-CM

## 2021-08-07 ENCOUNTER — Other Ambulatory Visit: Payer: Self-pay | Admitting: Nurse Practitioner

## 2021-08-07 DIAGNOSIS — E890 Postprocedural hypothyroidism: Secondary | ICD-10-CM

## 2021-09-03 ENCOUNTER — Other Ambulatory Visit: Payer: Self-pay | Admitting: *Deleted

## 2021-09-03 ENCOUNTER — Other Ambulatory Visit: Payer: Self-pay | Admitting: Nurse Practitioner

## 2021-09-03 DIAGNOSIS — E890 Postprocedural hypothyroidism: Secondary | ICD-10-CM

## 2021-09-03 MED ORDER — LEVOTHYROXINE SODIUM 137 MCG PO TABS: ORAL_TABLET | ORAL | 0 refills | Status: DC

## 2021-09-03 NOTE — Telephone Encounter (Signed)
Patient called and stated that she is out of her Levothyroxine. Stated that she has an upcoming appointment with Shanda Bumps and labs scheduled for this Friday.

## 2021-09-06 ENCOUNTER — Other Ambulatory Visit: Payer: PRIVATE HEALTH INSURANCE

## 2021-09-06 ENCOUNTER — Other Ambulatory Visit: Payer: Self-pay

## 2021-09-06 DIAGNOSIS — Z114 Encounter for screening for human immunodeficiency virus [HIV]: Secondary | ICD-10-CM

## 2021-09-06 DIAGNOSIS — Z1159 Encounter for screening for other viral diseases: Secondary | ICD-10-CM

## 2021-09-06 DIAGNOSIS — E782 Mixed hyperlipidemia: Secondary | ICD-10-CM

## 2021-09-06 DIAGNOSIS — E890 Postprocedural hypothyroidism: Secondary | ICD-10-CM

## 2021-09-06 DIAGNOSIS — R7303 Prediabetes: Secondary | ICD-10-CM

## 2021-09-09 ENCOUNTER — Other Ambulatory Visit: Payer: Self-pay

## 2021-09-09 DIAGNOSIS — Z1159 Encounter for screening for other viral diseases: Secondary | ICD-10-CM

## 2021-09-09 DIAGNOSIS — E782 Mixed hyperlipidemia: Secondary | ICD-10-CM

## 2021-09-09 DIAGNOSIS — Z114 Encounter for screening for human immunodeficiency virus [HIV]: Secondary | ICD-10-CM

## 2021-09-09 DIAGNOSIS — R7303 Prediabetes: Secondary | ICD-10-CM

## 2021-09-09 DIAGNOSIS — E890 Postprocedural hypothyroidism: Secondary | ICD-10-CM

## 2021-10-04 ENCOUNTER — Ambulatory Visit (INDEPENDENT_AMBULATORY_CARE_PROVIDER_SITE_OTHER): Payer: No Typology Code available for payment source | Admitting: Nurse Practitioner

## 2021-10-04 ENCOUNTER — Other Ambulatory Visit: Payer: Self-pay

## 2021-10-04 ENCOUNTER — Encounter: Payer: Self-pay | Admitting: Nurse Practitioner

## 2021-10-04 VITALS — BP 138/84 | HR 61 | Temp 97.1°F | Ht 63.08 in | Wt 245.0 lb

## 2021-10-04 DIAGNOSIS — E782 Mixed hyperlipidemia: Secondary | ICD-10-CM

## 2021-10-04 DIAGNOSIS — R0602 Shortness of breath: Secondary | ICD-10-CM

## 2021-10-04 DIAGNOSIS — R002 Palpitations: Secondary | ICD-10-CM

## 2021-10-04 DIAGNOSIS — E890 Postprocedural hypothyroidism: Secondary | ICD-10-CM

## 2021-10-04 DIAGNOSIS — Z1159 Encounter for screening for other viral diseases: Secondary | ICD-10-CM

## 2021-10-04 DIAGNOSIS — R7303 Prediabetes: Secondary | ICD-10-CM | POA: Diagnosis not present

## 2021-10-04 DIAGNOSIS — Z6841 Body Mass Index (BMI) 40.0 and over, adult: Secondary | ICD-10-CM

## 2021-10-04 DIAGNOSIS — R9431 Abnormal electrocardiogram [ECG] [EKG]: Secondary | ICD-10-CM

## 2021-10-04 DIAGNOSIS — F3342 Major depressive disorder, recurrent, in full remission: Secondary | ICD-10-CM

## 2021-10-04 NOTE — Progress Notes (Signed)
Careteam: Patient Care Team: Lauree Chandler, NP as PCP - General (Nurse Practitioner) Amalia Greenhouse, MD as Referring Physician (Endocrinology)  PLACE OF SERVICE:  [place] Directive information    Allergies  Allergen Reactions   Lipitor [Atorvastatin] Other (See Comments)    myalgias     Chief Complaint  Patient presents with   Annual Exam    Yearly check-up. Discuss need for hep c screening, shingrix, covid booster, colonoscopy  or post pone if patient refuses. Patient not taking Crestor due to making her feel weird, patient took x couple of weeks and discontinued.      HPI: Patient is a 65 y.o. female for routine follow up.   Had shingles in February, had flu shot already Needs covid booster in June   Mammogram in October- normal.   Having some dizziness at times. Randomly. Not frequent. Does not last long.   Recently noted some weird feeling in chest. No chest pain but not normal. Symptoms have been ongoing over the last 2-2.5 months.  Out of breath with minimal activity Reports palpitations. Both mother and father have/had heart history.  Her father had major CAD requiring CABG at 66  Anxiety is "out the roof" lots of stress at work. Her mother says she does not take care of her self.  She walks the dog twice daily- ~1 mile slow pace.  Overall more sedentary.  She is eating a lot of bread.  Not a lot of sugary sweet foods.   Continues with cough which has persisted since COVID.   Not taking crestor- did not like the way she felt on crestor- felt like "I was in mud"   Review of Systems:  Review of Systems  Constitutional:  Negative for chills, fever and weight loss.  HENT:  Negative for tinnitus.   Respiratory:  Negative for cough, sputum production and shortness of breath.   Cardiovascular:  Negative for chest pain, palpitations and leg swelling.  Gastrointestinal:  Negative for abdominal pain, constipation, diarrhea and heartburn.   Genitourinary:  Negative for dysuria, frequency and urgency.  Musculoskeletal:  Negative for back pain, falls, joint pain and myalgias.  Skin: Negative.   Neurological:  Negative for dizziness and headaches.  Psychiatric/Behavioral:  Negative for depression and memory loss. The patient does not have insomnia.    Past Medical History:  Diagnosis Date   Hyperlipidemia    Hypertension    Thyroid disease    Hyperthyroidism   Past Surgical History:  Procedure Laterality Date   APPENDECTOMY  11/2017   CESAREAN SECTION  [place] MD   [place] MD   [place], MD   EYELID LACERATION REPAIR  08/2014   Social History:   reports that she has never smoked. She has never used smokeless tobacco. She reports current alcohol use. She reports that she does not use drugs.  Family History  Problem Relation Age of Onset   Cancer Mother        Breast   Colitis Mother    Osteoporosis Mother    Diabetes Father        Type 2   Cancer Maternal Grandmother        63   Cancer Maternal Grandfather        68    Medications: Patient's Medications  New Prescriptions   No medications on file  Previous Medications   CITALOPRAM (CELEXA)  20 MG TABLET    TAKE 1 TABLET BY MOUTH DAILY   LEVOTHYROXINE (SYNTHROID) 137 MCG TABLET    TAKE 1 TABLET BY MOUTH DAILY   ROSUVASTATIN (CRESTOR) 5 MG TABLET    Take 5 mg by mouth daily.  Modified Medications   No medications on file  Discontinued Medications   No medications on file    Physical Exam:  Vitals:   10/04/21 1108  BP: 138/84  Pulse: 61  Temp: (!) 97.1 F (36.2 C)  TempSrc: Temporal  SpO2: 99%  Weight: 245 lb (111.1 kg)  Height: 5' 3.08" (1.602 m)   Body mass index is 43.3 kg/m. Wt Readings from Last 3 Encounters:  10/04/21 245 lb (111.1 kg)  12/03/20 234 lb 6.4 oz (106.3 kg)  08/31/20 241 lb 12.8 oz (109.7 kg)    Physical Exam Constitutional:      General: She is not in acute  distress.    Appearance: She is well-developed. She is not diaphoretic.  HENT:     Head: Normocephalic and atraumatic.     Mouth/Throat:     Pharynx: No oropharyngeal exudate.  Eyes:     Conjunctiva/sclera: Conjunctivae normal.     Pupils: Pupils are equal, round, and reactive to light.  Cardiovascular:     Rate and Rhythm: Normal rate and regular rhythm.     Heart sounds: Normal heart sounds.  Pulmonary:     Effort: Pulmonary effort is normal.     Breath sounds: Normal breath sounds.  Abdominal:     General: Bowel sounds are normal.     Palpations: Abdomen is soft.  Musculoskeletal:     Cervical back: Normal range of motion and neck supple.     Right lower leg: No edema.     Left lower leg: No edema.  Skin:    General: Skin is warm and dry.  Neurological:     Mental Status: She is alert and oriented to person, place, and time.     Gait: Gait normal.  Psychiatric:        Mood and Affect: Mood normal.    Labs reviewed: Basic Metabolic Panel: No results for input(s): NA, K, CL, CO2, GLUCOSE, BUN, CREATININE, CALCIUM, MG, PHOS, TSH in the last 8760 hours. Liver Function Tests: No results for input(s): AST, ALT, ALKPHOS, BILITOT, PROT, ALBUMIN in the last 8760 hours. No results for input(s): LIPASE, AMYLASE in the last 8760 hours. No results for input(s): AMMONIA in the last 8760 hours. CBC: No results for input(s): WBC, NEUTROABS, HGB, HCT, MCV, PLT in the last 8760 hours. Lipid Panel: No results for input(s): CHOL, HDL, LDLCALC, TRIG, CHOLHDL, LDLDIRECT in the last 8760 hours. TSH: No results for input(s): TSH in the last 8760 hours. A1C: Lab Results  Component Value Date   HGBA1C 5.8 09/05/2019     Assessment/Plan 1. Need for hepatitis C screening test - Hepatitis C antibody  2. Mixed hyperlipidemia -has been off statin due to side effects. Will follow up lab at this time.  - Lipid panel - CBC with Differential/Platelet  3. Prediabetes -lifestyle  modifications strongly encouraged at this time.  - CMP with eGFR([facility]) - Hemoglobin A1c  4. 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 Will follow up lab.  - TSH  5. Class 3 severe obesity due to excess calories with serious comorbidity and body mass index (BMI) of 40.0 to 44.9 in adult [facility]) -education provided on healthy weight loss through increase in physical activity and proper  nutrition   6. Recurrent major depressive disorder, in full remission ([place]) -has been stable on celexa   7. Shortness of breath - DG Chest 2 View to be obtained for further evaluation due to chronic cough and shortness of breath  - EKG 12-Lead - Ambulatory referral to Cardiology  8. Palpitation - EKG 12-Lead - Ambulatory referral to Cardiology  9. Abnormal EKG -due to family hx, slight ST elevation in lead 2 and V5 and symptoms will send to cardiology for further evaluation at this time. If she has any chest pain to go to ED immediately  - Ambulatory referral to Cardiology   Next appt: 3 months, sooner if needed  Jessica K. Dougherty, [facility] [phone]

## 2021-10-04 NOTE — Patient Instructions (Addendum)
To sign record release for your GI doctor for your colonoscopy   To get chest xray at [facility] imaging once you leave here.   weight loss through increase in physical activity and proper nutrition encouraged.

## 2021-10-07 LAB — HEMOGLOBIN A1C
Hgb A1c MFr Bld: 5.7 % of total Hgb — ABNORMAL HIGH (ref <5.7)
Mean Plasma Glucose: 117 mg/dL
eAG (mmol/L): 6.5 mmol/L

## 2021-10-07 LAB — COMPLETE METABOLIC PANEL WITHOUT GFR
AG Ratio: 1.8 (calc) (ref 1.0–2.5)
ALT: 27 U/L (ref 6–29)
AST: 23 U/L (ref 10–35)
Albumin: 4.2 g/dL (ref 3.6–5.1)
Alkaline phosphatase (APISO): 81 U/L (ref 37–153)
BUN: 17 mg/dL (ref 7–25)
CO2: 26 mmol/L (ref 20–32)
Calcium: 9.3 mg/dL (ref 8.6–10.4)
Chloride: 104 mmol/L (ref 98–110)
Creat: 0.86 mg/dL (ref 0.50–1.05)
Globulin: 2.4 g/dL (ref 1.9–3.7)
Glucose, Bld: 83 mg/dL (ref 65–99)
Potassium: 4.3 mmol/L (ref 3.5–5.3)
Sodium: 139 mmol/L (ref 135–146)
Total Bilirubin: 0.8 mg/dL (ref 0.2–1.2)
Total Protein: 6.6 g/dL (ref 6.1–8.1)
eGFR: 75 mL/min/1.73m2

## 2021-10-07 LAB — CBC WITH DIFFERENTIAL/PLATELET
Absolute Monocytes: 449 cells/uL (ref 200–950)
Basophils Absolute: 68 cells/uL (ref 0–200)
Basophils Relative: 1 %
Eosinophils Absolute: 197 cells/uL (ref 15–500)
Eosinophils Relative: 2.9 %
HCT: 44.1 % (ref 35.0–45.0)
Hemoglobin: 14 g/dL (ref 11.7–15.5)
Lymphs Abs: 3203 cells/uL (ref 850–3900)
MCH: 28.5 pg (ref 27.0–33.0)
MCHC: 31.7 g/dL — ABNORMAL LOW (ref 32.0–36.0)
MCV: 89.8 fL (ref 80.0–100.0)
MPV: 11.8 fL (ref 7.5–12.5)
Monocytes Relative: 6.6 %
Neutro Abs: 2883 cells/uL (ref 1500–7800)
Neutrophils Relative %: 42.4 %
Platelets: 219 10*3/uL (ref 140–400)
RBC: 4.91 10*6/uL (ref 3.80–5.10)
RDW: 12.4 % (ref 11.0–15.0)
Total Lymphocyte: 47.1 %
WBC: 6.8 10*3/uL (ref 3.8–10.8)

## 2021-10-07 LAB — LIPID PANEL
Cholesterol: 209 mg/dL — ABNORMAL HIGH (ref <200)
HDL: 73 mg/dL (ref >=50)
LDL Cholesterol (Calc): 111 mg/dL (calc) — ABNORMAL HIGH
Non-HDL Cholesterol (Calc): 136 mg/dL (calc) — ABNORMAL HIGH (ref <130)
Total CHOL/HDL Ratio: 2.9 (calc) (ref <5.0)
Triglycerides: 141 mg/dL (ref <150)

## 2021-10-07 LAB — TSH: TSH: 1.15 m[IU]/L (ref 0.40–4.50)

## 2021-10-07 LAB — HEPATITIS C ANTIBODY
Hepatitis C Ab: NONREACTIVE
SIGNAL TO CUT-OFF: 0.02 (ref <1.00)

## 2021-10-08 ENCOUNTER — Other Ambulatory Visit: Payer: Self-pay | Admitting: Nurse Practitioner

## 2021-10-08 DIAGNOSIS — F32A Depression, unspecified: Secondary | ICD-10-CM

## 2021-10-31 ENCOUNTER — Other Ambulatory Visit: Payer: Self-pay | Admitting: Nurse Practitioner

## 2021-10-31 DIAGNOSIS — E890 Postprocedural hypothyroidism: Secondary | ICD-10-CM

## 2021-11-08 ENCOUNTER — Encounter: Payer: Self-pay | Admitting: Cardiology

## 2021-11-08 ENCOUNTER — Other Ambulatory Visit: Payer: Self-pay

## 2021-11-08 ENCOUNTER — Ambulatory Visit (INDEPENDENT_AMBULATORY_CARE_PROVIDER_SITE_OTHER): Payer: No Typology Code available for payment source | Admitting: Cardiology

## 2021-11-08 VITALS — BP 138/86 | HR 66 | Ht 63.5 in | Wt 247.0 lb

## 2021-11-08 DIAGNOSIS — R0789 Other chest pain: Secondary | ICD-10-CM

## 2021-11-08 DIAGNOSIS — R0609 Other forms of dyspnea: Secondary | ICD-10-CM

## 2021-11-08 DIAGNOSIS — Z8616 Personal history of COVID-19: Secondary | ICD-10-CM | POA: Insufficient documentation

## 2021-11-08 DIAGNOSIS — R42 Dizziness and giddiness: Secondary | ICD-10-CM

## 2021-11-08 DIAGNOSIS — E785 Hyperlipidemia, unspecified: Secondary | ICD-10-CM

## 2021-11-08 DIAGNOSIS — R079 Chest pain, unspecified: Secondary | ICD-10-CM

## 2021-11-08 MED ORDER — ASPIRIN 81 MG PO CHEW: 81.0000 mg | CHEWABLE_TABLET | Freq: Once | ORAL | Status: DC

## 2021-11-08 MED ORDER — METOPROLOL TARTRATE 100 MG PO TABS: 100.0000 mg | ORAL_TABLET | Freq: Once | ORAL | 0 refills | Status: DC

## 2021-11-08 NOTE — Patient Instructions (Addendum)
Medication Instructions:  Your physician has recommended you make the following change in your medication:   START: baby Aspirin 81 mg daily  *If you need a refill on your cardiac medications before your next appointment, please call your pharmacy*   Lab Work: BMP 1 week before CT If you have labs (blood work) drawn today and your tests are completely normal, you will receive your results only by: [place] (if you have MyChart) OR A paper copy in the mail If you have any lab test that is abnormal or we need to change your treatment, we will call you to review the results.   Testing/Procedures: Your physician has requested that you have an echocardiogram. Echocardiography is a painless test that uses sound waves to create images of your heart. It provides your doctor with information about the size and shape of your heart and how well your hearts chambers and valves are working. This procedure takes approximately one hour. There are no restrictions for this procedure.     Your cardiac CT will be scheduled at one of the below locations:   [facility] [address] [place], [place] [address] [phone]  [place] [address] [place],  [address] [phone]  If scheduled at [facility], please arrive at the [facility] main entrance (entrance A) of [facility] 30 minutes prior to test start time. You can use the FREE valet parking offered at the main entrance (encouraged to control the heart rate for the test) Proceed to the [facility] Radiology Department (first floor) to check-in and test prep.  If scheduled at [facility], please arrive 15 mins early for check-in and test prep.  Please follow these instructions carefully (unless otherwise directed):    On the Night Before the Test: Be sure to Drink plenty of water. Do not consume any  caffeinated/decaffeinated beverages or chocolate 12 hours prior to your test. Do not take any antihistamines 12 hours prior to your test.  On the Day of the Test: Drink plenty of water until 1 hour prior to the test. Do not eat any food 4 hours prior to the test. You may take your regular medications prior to the test.  Take metoprolol (Lopressor) two hours prior to test. FEMALES- please wear underwire-free bra if available, avoid dresses & tight clothing      After the Test: Drink plenty of water. After receiving IV contrast, you may experience a mild flushed feeling. This is normal. On occasion, you may experience a mild rash up to 24 hours after the test. This is not dangerous. If this occurs, you can take Benadryl 25 mg and increase your fluid intake. If you experience trouble breathing, this can be serious. If it is severe call 911 IMMEDIATELY. If it is mild, please call our office.   We will call to schedule your test 2-4 weeks out understanding that some insurance companies will need an authorization prior to the service being performed.   For non-scheduling related questions, please contact the cardiac imaging nurse navigator should you have any questions/concerns: Marchia Bond, Cardiac Imaging Nurse Navigator Gordy Clement, Cardiac Imaging Nurse Navigator [facility] Direct Office Dial: [phone]   For scheduling needs, including cancellations and rescheduling, please call [place], [phone].    Follow-Up: At [facility], you and your health needs are our priority.  As part of our continuing mission to provide you  with exceptional heart care, we have created designated Provider Care Teams.  These Care Teams include your primary Cardiologist (physician) and Advanced Practice Providers (APPs -  Physician Assistants and Nurse Practitioners) who all work together to provide you with the care you need, when you need it.  We recommend signing  up for the patient portal called "MyChart".  Sign up information is provided on this After Visit Summary.  MyChart is used to connect with patients for Virtual Visits (Telemedicine).  Patients are able to view lab/test results, encounter notes, upcoming appointments, etc.  Non-urgent messages can be sent to your provider as well.   To learn more about what you can do with MyChart, go to [email or web address].    Your next appointment:   2 month(s)  The format for your next appointment:   In Person  Provider:   Jenne Campus, MD    Other Instructions None

## 2021-11-08 NOTE — Progress Notes (Signed)
Cardiology Consultation:    Date:  11/08/2021   ID:  Geri, Ursin 11/01/1955, MRN [id number]  PCP:  Lauree Chandler, NP  Cardiologist:  Jenne Campus, MD   Referring MD: Lauree Chandler, NP   Chief Complaint  Patient presents with   Chest Pain   Shortness of Breath        Dizziness    Ongoing for six months    History of Present Illness:    Kristen Moran is a 66 y.o. female who is being seen today for the evaluation of of shortness of breath and chest pain at the request of Lauree Chandler, NP.  Past medical history significant for dyslipidemia: Hypertension, insomnia she suffered from COVID-19 infection since that time she is been complaining of having a lot of problems first of all she does have some pain in the side of her neck on the left side she said this sensation is there all the time also complained of having some stabbing sharp sensation in the left side of her chest not related to exercise another complaint is shortness of breath.  She was able to do much more before for example walking uphill will give her some shortness of breath now before she did not have it.  She also complained of having some dizziness dizziness happen typically when she tries to get up very quickly.  She denies have any swelling of lower extremities, she does have severe sleep apnea she did try CPAP mask but was not able to tolerate it. She did not smoke cigarettes she used to smoke marijuana long time ago.  She does not drink alcohol He does have family history of premature coronary artery disease apparently her brother end up having to myocardial infarction and he does have cardiomyopathy with ejection fraction 30%. She does not exercise on the regular basis She is not on any special diet  Past Medical History:  Diagnosis Date   Hyperlipidemia    Hypertension    Thyroid disease    Hyperthyroidism    Past Surgical History:  Procedure Laterality Date    APPENDECTOMY  11/2017   [place] MD   [place]   [place] MD   [place], MD   EYELID LACERATION REPAIR  08/2014    Current Medications: Current Meds  Medication Sig   citalopram (CELEXA) 20 MG tablet TAKE 1 TABLET BY MOUTH DAILY (Patient taking differently: Take 20 mg by mouth daily.)   levothyroxine (SYNTHROID) 137 MCG tablet TAKE 1 TABLET BY MOUTH DAILY (Patient taking differently: Take 137 mcg by mouth daily before breakfast. TAKE 1 TABLET BY MOUTH DAILY)     Allergies:   Lipitor [atorvastatin]   Social History   Socioeconomic History   Marital status: Married    Spouse name: Not on file   Number of children: Not on file   Years of education: Not on file   Highest education level: Not on file  Occupational History   Not on file  Tobacco Use   Smoking status: Never   Smokeless tobacco: Never  Vaping Use   Vaping Use: Never used  Substance and Sexual Activity   Alcohol use: Yes    Alcohol/week: 0.0 standard drinks    Comment: 1-2 a month   Drug use: No   Sexual activity: Not Currently  Other Topics Concern   Not on file  Social History Narrative  Not on file   Social Determinants of Health   Financial Resource Strain: Not on file  Food Insecurity: Not on file  Transportation Needs: Not on file  Physical Activity: Not on file  Stress: Not on file  Social Connections: Not on file     Family History: The patient's family history includes Cancer in her maternal grandfather, maternal grandmother, and mother; Colitis in her mother; Diabetes in her father; Osteoporosis in her mother. ROS:   Please see the history of present illness.    All 14 point review of systems negative except as described per history of present illness.  EKGs/Labs/Other Studies Reviewed:    The following studies were reviewed today:   EKG:  EKG is  ordered today.  The ekg ordered today demonstrates normal sinus rhythm normal  P interval cannot rule out anterior MI, nonspecific ST segment changes  Recent Labs: 10/04/2021: ALT 27; BUN 17; Creat 0.86; Hemoglobin 14.0; Platelets 219; Potassium 4.3; Sodium 139; TSH 1.15  Recent Lipid Panel    Component Value Date/Time   CHOL 209 (H) 10/04/2021 1152   CHOL 202 (H) 11/28/2015 0959   TRIG 141 10/04/2021 1152   HDL 73 10/04/2021 1152   HDL 82 11/28/2015 0959   CHOLHDL 2.9 10/04/2021 1152   VLDL 23 05/15/2017 0854   LDLCALC 111 (H) 10/04/2021 1152    Physical Exam:    VS:  BP 138/86 (BP Location: Right Arm, Patient Position: Sitting)    Pulse 66    Ht 5' 3.5" (1.613 m)    Wt 247 lb (112 kg)    SpO2 97%    BMI 43.07 kg/m     Wt Readings from Last 3 Encounters:  11/08/21 247 lb (112 kg)  10/04/21 245 lb (111.1 kg)  12/03/20 234 lb 6.4 oz (106.3 kg)     GEN:  Well nourished, well developed in no acute distress HEENT: Normal NECK: No JVD; No carotid bruits LYMPHATICS: No lymphadenopathy CARDIAC: RRR, no murmurs, no rubs, no gallops RESPIRATORY:  Clear to auscultation without rales, wheezing or rhonchi  ABDOMEN: Soft, non-tender, non-distended MUSCULOSKELETAL:  No edema; No deformity  SKIN: Warm and dry NEUROLOGIC:  Alert and oriented x 3 PSYCHIATRIC:  Normal affect   ASSESSMENT:    1. Dyslipidemia   2. Dyspnea on exertion   3. Atypical chest pain   4. History of COVID-19   5. Dizziness    PLAN:    In order of problems listed above:  Dyspnea on exertion multifactorial after COVID.  I will ask her to have echocardiogram to assess left ventricle ejection fraction another reason for echocardiogram is the fact that she have poor R wave progression anterior precordium raising suspicion for anterior wall myocardial infarction. Atypical chest pain.  I will ask her to start taking 1 baby aspirin every single day, I will schedule her to have coronary CT angio.  That should help with assessment of both lower coronary trees as well as lungs after COVID.  In the  meantime we will start aspirin. Dyslipidemia I did calculated 10 years predicted risk for coronary artery disease which is 5.2%.  This is borderline.  No need to initiate therapy for dyslipidemia now but will wait for results of coronary CT angiogram calcium score. Dizziness looks like only when she gets out by denting this is arrhythmic issue I think this is simply orthostatic hypotension she needs to stay well-hydrated.  5.  Severe sleep apnea, she was not able to tolerate CPAP.  However in the future we will consider more advanced management of her obstructive sleep apnea   Medication Adjustments/Labs and Tests Ordered: Current medicines are reviewed at length with the patient today.  Concerns regarding medicines are outlined above.  No orders of the defined types were placed in this encounter.  No orders of the defined types were placed in this encounter.   Signed, Park Liter, MD, [facility]. 11/08/2021 11:51 AM    [place]

## 2021-11-29 ENCOUNTER — Other Ambulatory Visit: Payer: Self-pay

## 2021-11-29 ENCOUNTER — Ambulatory Visit (HOSPITAL_BASED_OUTPATIENT_CLINIC_OR_DEPARTMENT_OTHER)
Admission: RE | Admit: 2021-11-29 | Discharge: 2021-11-29 | Disposition: A | Discharge status: Home / Self Care | Payer: No Typology Code available for payment source | Source: Ambulatory Visit | Attending: Cardiology | Admitting: Cardiology

## 2021-11-29 DIAGNOSIS — E785 Hyperlipidemia, unspecified: Secondary | ICD-10-CM | POA: Insufficient documentation

## 2021-11-29 DIAGNOSIS — R079 Chest pain, unspecified: Secondary | ICD-10-CM | POA: Diagnosis present

## 2021-11-29 DIAGNOSIS — Z8616 Personal history of COVID-19: Secondary | ICD-10-CM | POA: Insufficient documentation

## 2021-11-29 DIAGNOSIS — R0609 Other forms of dyspnea: Secondary | ICD-10-CM | POA: Insufficient documentation

## 2021-11-29 DIAGNOSIS — R42 Dizziness and giddiness: Secondary | ICD-10-CM | POA: Insufficient documentation

## 2021-11-29 DIAGNOSIS — R0789 Other chest pain: Secondary | ICD-10-CM | POA: Diagnosis present

## 2021-11-29 LAB — ECHOCARDIOGRAM COMPLETE
AR max vel: 2.38 cm2
AV Area VTI: 2.36 cm2
AV Area mean vel: 2.3 cm2
AV Mean grad: 3 mmHg
AV Peak grad: 5.8 mmHg
Ao pk vel: 1.2 m/s
Area-P 1/2: 2.36 cm2
S' Lateral: 2.6 cm

## 2021-11-29 NOTE — Progress Notes (Signed)
°  Echocardiogram 2D Echocardiogram has been performed.  Roosvelt Maser F 11/29/2021, 11:06 AM

## 2021-12-03 ENCOUNTER — Telehealth: Payer: Self-pay | Admitting: Cardiology

## 2021-12-03 ENCOUNTER — Telehealth (HOSPITAL_COMMUNITY): Payer: Self-pay | Admitting: *Deleted

## 2021-12-03 NOTE — Telephone Encounter (Signed)
Patient informed of result.

## 2021-12-03 NOTE — Telephone Encounter (Signed)
° °  Pt is returning call to get echo result °

## 2021-12-03 NOTE — Telephone Encounter (Signed)
Attempted to call patient regarding upcoming cardiac CT appointment and to remind her she will need blood work prior to that appointment. Left message on voicemail with name and callback number  Larey Brick RN Navigator Cardiac [facility] [phone] Office [phone] Cell

## 2021-12-05 ENCOUNTER — Telehealth (HOSPITAL_COMMUNITY): Payer: Self-pay | Admitting: *Deleted

## 2021-12-05 NOTE — Telephone Encounter (Signed)
Reaching out to patient to offer assistance regarding upcoming cardiac imaging study; pt verbalizes understanding of appt date/time, parking situation and where to check in, pre-test NPO status and medications ordered, and verified current allergies; name and call back number provided for further questions should they arise  Larey Brick RN Navigator Cardiac Imaging [facility] [phone] office [phone] cell  Patient to take 50mg  metoprolol tartrate two hours prior to her cardiac CT scan.  She is aware to arrive at 9:30am for her 10am scan. She will get blood work today.

## 2021-12-06 ENCOUNTER — Encounter (HOSPITAL_BASED_OUTPATIENT_CLINIC_OR_DEPARTMENT_OTHER): Payer: Self-pay

## 2021-12-06 ENCOUNTER — Other Ambulatory Visit: Payer: Self-pay

## 2021-12-06 ENCOUNTER — Ambulatory Visit (HOSPITAL_COMMUNITY)
Admission: RE | Admit: 2021-12-06 | Discharge: 2021-12-06 | Disposition: A | Discharge status: Home / Self Care | Payer: No Typology Code available for payment source | Source: Ambulatory Visit | Attending: Cardiology | Admitting: Cardiology

## 2021-12-06 ENCOUNTER — Telehealth: Payer: Self-pay | Admitting: Cardiology

## 2021-12-06 ENCOUNTER — Emergency Department (HOSPITAL_BASED_OUTPATIENT_CLINIC_OR_DEPARTMENT_OTHER)
Admission: EM | Admit: 2021-12-06 | Discharge: 2021-12-06 | Disposition: A | Discharge status: Home / Self Care | Payer: No Typology Code available for payment source | Attending: Emergency Medicine | Admitting: Emergency Medicine

## 2021-12-06 DIAGNOSIS — I2694 Multiple subsegmental pulmonary emboli without acute cor pulmonale: Secondary | ICD-10-CM | POA: Insufficient documentation

## 2021-12-06 DIAGNOSIS — R0602 Shortness of breath: Secondary | ICD-10-CM | POA: Diagnosis present

## 2021-12-06 DIAGNOSIS — R079 Chest pain, unspecified: Secondary | ICD-10-CM

## 2021-12-06 LAB — BASIC METABOLIC PANEL
Anion gap: 8 (ref 5–15)
BUN/Creatinine Ratio: 19 (ref 12–28)
BUN: 16 mg/dL (ref 8–27)
BUN: 15 mg/dL (ref 8–23)
CO2: 21 mmol/L (ref 20–29)
CO2: 25 mmol/L (ref 22–32)
Calcium: 9.3 mg/dL (ref 8.7–10.3)
Calcium: 9.4 mg/dL (ref 8.9–10.3)
Chloride: 102 mmol/L (ref 96–106)
Chloride: 100 mmol/L (ref 98–111)
Creatinine, Ser: 0.83 mg/dL (ref 0.57–1.00)
Creatinine, Ser: 0.98 mg/dL (ref 0.44–1.00)
GFR, Estimated: >60 mL/min (ref >60)
Glucose, Bld: 100 mg/dL — ABNORMAL HIGH (ref 70–99)
Glucose: 90 mg/dL (ref 70–99)
Potassium: 4.5 mmol/L (ref 3.5–5.2)
Potassium: 3.9 mmol/L (ref 3.5–5.1)
Sodium: 140 mmol/L (ref 134–144)
Sodium: 133 mmol/L — ABNORMAL LOW (ref 135–145)
eGFR: 79 mL/min/{1.73_m2} (ref >59)

## 2021-12-06 LAB — TROPONIN I (HIGH SENSITIVITY): Troponin I (High Sensitivity): 5 ng/L (ref <18)

## 2021-12-06 LAB — POCT I-STAT CREATININE: Creatinine, Ser: 1 mg/dL (ref 0.44–1.00)

## 2021-12-06 MED ORDER — APIXABAN (ELIQUIS) EDUCATION KIT FOR DVT/PE PATIENTS: PACK | Freq: Once | Status: AC

## 2021-12-06 MED ORDER — APIXABAN (ELIQUIS) VTE STARTER PACK (10MG AND 5MG): ORAL_TABLET | ORAL | 0 refills | Status: DC

## 2021-12-06 MED ORDER — NITROGLYCERIN 0.4 MG SL SUBL: 0.8000 mg | SUBLINGUAL_TABLET | Freq: Once | SUBLINGUAL | Status: AC

## 2021-12-06 MED ORDER — NITROGLYCERIN 0.4 MG SL SUBL
SUBLINGUAL_TABLET | SUBLINGUAL | Status: AC
  Administered 2021-12-06: 0.8 mg via SUBLINGUAL
  Filled 2021-12-06: qty 2

## 2021-12-06 MED ORDER — APIXABAN 2.5 MG PO TABS
10.0000 mg | ORAL_TABLET | Freq: Once | ORAL | Status: AC
  Administered 2021-12-06: 10 mg via ORAL
  Filled 2021-12-06: qty 4

## 2021-12-06 MED ORDER — IOHEXOL 350 MG/ML SOLN
100.0000 mL | Freq: Once | INTRAVENOUS | Status: AC | PRN
  Administered 2021-12-06: 100 mL via INTRAVENOUS

## 2021-12-06 NOTE — Telephone Encounter (Signed)
[facility] Radiology... stat call

## 2021-12-06 NOTE — Discharge Instructions (Addendum)
You were seen here today for evaluation after your CT scan showed pulmonary embolisms. Your EKG, electrolytes, and kidney function were normal.  We have started you on Eliquis to take twice daily.  Please see the education pamphlet given to you for additional information.  Starting tomorrow, you will take 10 mg of Eliquis twice a day for the first 7 days and then switch to 5 mg twice a day. You will most likely need to be on this for anywhere between 3 to 6 months. You will need to see either your cardiologist or your primary care provider for additional refills on this medication.  Additionally, you will likely need a repeat dedicated PE CT scan in 3 to 6 months to make sure the clots have dissolved.  Also, please stop taking your aspirin as this can increase your risk for bleeding.   If you have any blood in your urine, dark/tarry stools, or any falls involving major abdominal or head injuries, please return to the nearest emergency department for evaluation.

## 2021-12-06 NOTE — ED Provider Notes (Signed)
[facility] EMERGENCY DEPARTMENT Provider Note   CSN: [id number] Arrival date & time: 12/06/21  1618     History Chief Complaint  Patient presents with   Shortness of Breath    Kristen Moran is a 66 y.o. female presents to the emergency department for evaluation after positive CT scan today.  Patient reports she has been having shortness of breath for the past 8 months as well as some rare palpitations.  She reports some dyspnea on exertion occasionally but not always.  She was seen by her PCP in a cardiologist to find the root of her shortness of breath.  The cardiologist ordered a CTA chest and calcium scan which revealed small volume bilateral PEs in the right middle and left lower lobes.  She denies any history of PEs, DVTs, history of cancer, exogenous hormone use, or smoking.  Patient reports that [place] whenever she had the shortness of breath, but she also had COVID and thought it was from that.  She reports she has had this shortness of breath after having COVID.  Shortness of Breath Associated symptoms: no abdominal pain, no chest pain, no cough and no vomiting       Home Medications Prior to Admission medications   Medication Sig Start Date End Date Taking? Authorizing Provider  citalopram (CELEXA) 20 MG tablet TAKE 1 TABLET BY MOUTH DAILY Patient taking differently: Take 20 mg by mouth daily. 10/09/21   Sharon SellerEubanks, Jessica K, NP  levothyroxine (SYNTHROID) 137 MCG tablet TAKE 1 TABLET BY MOUTH DAILY Patient taking differently: Take 137 mcg by mouth daily before breakfast. TAKE 1 TABLET BY MOUTH DAILY 10/31/21   Sharon SellerEubanks, Jessica K, NP  metoprolol tartrate (LOPRESSOR) 100 MG tablet Take 1 tablet (100 mg total) by mouth once for 1 dose. Please take 2 hours before CT 11/08/21 11/08/21  Georgeanna LeaKrasowski, Robert J, MD      Allergies    Lipitor [atorvastatin]    Review of Systems   Review of Systems  Respiratory:  Positive for shortness of  breath. Negative for cough and chest tightness.   Cardiovascular:  Positive for palpitations. Negative for chest pain and leg swelling.  Gastrointestinal:  Negative for abdominal pain, constipation, nausea and vomiting.   Physical Exam Updated Vital Signs BP (!) 172/86 (BP Location: Left Arm)    Pulse (!) 58    Temp 98 F (36.7 C) (Oral)    Resp 18    Ht 5\' 3"  (1.6 m)    Wt 111.1 kg    SpO2 95%    BMI 43.40 kg/m  Physical Exam Constitutional:      Appearance: Normal appearance.  HENT:     Head: Normocephalic and atraumatic.  Eyes:     General: No scleral icterus. Cardiovascular:     Rate and Rhythm: Regular rhythm. Bradycardia present.     Pulses: Normal pulses.  Pulmonary:     Effort: Pulmonary effort is normal. No accessory muscle usage or respiratory distress.     Breath sounds: Normal breath sounds. No decreased breath sounds.     Comments: Clear to auscultation bilaterally.  No respiratory distress, assessor muscle use, tripoding, nasal flaring, or cyanosis present.  The patient is speaking full sentences with ease and is satting 99% on room air. Abdominal:     General: Abdomen is flat. Bowel sounds are normal.     Palpations: Abdomen is soft.  Musculoskeletal:        General: No deformity.  Cervical back: Normal range of motion.     Right lower leg: No tenderness. No edema.     Left lower leg: No tenderness. No edema.  Skin:    General: Skin is warm and dry.  Neurological:     General: No focal deficit present.     Mental Status: She is alert. Mental status is at baseline.    ED Results / Procedures / Treatments   Labs (all labs ordered are listed, but only abnormal results are displayed) Labs Reviewed - No data to display  EKG None  Radiology CT CORONARY MORPH W/CTA COR W/SCORE W/CA W/CM &/OR WO/CM  Addendum Date: 12/06/2021   ADDENDUM REPORT: 12/06/2021 12:52 CLINICAL DATA:  Chest pain EXAM: Cardiac/Coronary CTA TECHNIQUE: A non-contrast, gated CT scan was  obtained with axial slices of 3 mm through the heart for calcium scoring. Calcium scoring was performed using the Agatston method. A 100 kV prospective, gated, contrast cardiac scan was obtained. Gantry rotation speed was 250 msecs and collimation was 0.6 mm. Two sublingual nitroglycerin tablets (0.8 mg) were given. The 3D data set was reconstructed in 5% intervals of the 35-75% of the R-R cycle. Diastolic phases were analyzed on a dedicated workstation using MPR, MIP, and VRT modes. The patient received 95 cc of contrast. FINDINGS: Image quality: Excellent. Noise artifact is: Limited. Coronary Arteries:  Normal coronary origin.  Right dominance. Left main: The left main is a large caliber vessel with a normal take off from the left coronary cusp that bifurcates to form a left anterior descending artery and a left circumflex artery. There is no plaque or stenosis. Left anterior descending artery: There is minimal calcified plaque (<25%) in the proximal LAD. The mid LAD contains minimal non-calcified plaque (<25%). The distal LAD is patent. The first diagonal contains minimal calcified plaque (<25%). Left circumflex artery: The LCX is non-dominant. There is minimal non-calcified plaque in the mid LCX (<25%). The LCX gives off 2 patent obtuse marginal branches. Right coronary artery: The RCA is dominant with normal take off from the right coronary cusp. There is no evidence of plaque or stenosis. The RCA terminates as a PDA and right posterolateral branch without evidence of plaque or stenosis. Right Atrium: Right atrial size is within normal limits. Right Ventricle: The right ventricular cavity is within normal limits. Left Atrium: Left atrial size is dilated with no left atrial appendage filling defect. Left Ventricle: The ventricular cavity size is within normal limits. There are no stigmata of prior infarction. There is no abnormal filling defect. Pulmonary arteries: Dilated pulmonary artery suggestive of  pulmonary hypertension. No proximal filling defect. Pulmonary veins: Normal pulmonary venous drainage. Pericardium: Normal thickness with no significant effusion or calcium present. Cardiac valves: The aortic valve is trileaflet without significant calcification. The mitral valve is normal structure without significant calcification. Aorta: Normal caliber with no significant disease. Extra-cardiac findings: See attached radiology report for non-cardiac structures. IMPRESSION: 1. Coronary calcium score of 6.8. This was 60th percentile for age-, sex, and race-matched controls. 2. Normal coronary origin with right dominance. 3. 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 4. Dilated pulmonary artery suggestive of pulmonary hypertension. RECOMMENDATIONS: 1. Minimal non-obstructive CAD (0-24%). Consider non-atherosclerotic causes of chest pain. Consider preventive therapy and risk factor modification. Lennie Odor, MD Electronically Signed   By: Lennie Odor M.D.   On: 12/06/2021 12:52   Result Date: 12/06/2021 EXAM: OVER-READ INTERPRETATION  CT CHEST The following report is an over-read performed by radiologist Dr. Jeronimo Greaves of  [facility] on 12/06/2021. This over-read does not include interpretation of cardiac or coronary anatomy or pathology. The coronary CTA interpretation by the cardiologist is attached. COMPARISON:  10/16/2014 chest CT from [facility], report only. FINDINGS: Vascular: Aortic atherosclerosis. Pulmonary artery enlargement, 3.2 cm. Subtle subsegmental pulmonary emboli identified. Example right middle lobe on images 18-20 of series 10. Left lower lobe images 24 through 28 of series 10 and coronal image 81/602. Also well delineated on series 6. Mediastinum/Nodes: No imaged thoracic adenopathy. Mildly dilated lower esophagus with air-fluid level within on 10/10. Lungs/Pleura: No pleural fluid. Right lower lobe subpleural scarring. Upper Abdomen: Hepatic steatosis. Musculoskeletal: No  acute osseous abnormality. IMPRESSION: 1. Small volume bilateral pulmonary emboli, as detailed above. 2. Pulmonary artery enlargement suggests pulmonary arterial hypertension. 3.  Aortic Atherosclerosis (ICD10-I70.0). 4. Hepatic steatosis 5. Esophageal air fluid level suggests dysmotility or gastroesophageal reflux. These results will be called to the ordering clinician or representative by the Radiologist Assistant, and communication documented in the PACS or [employer]. Electronically Signed: By: Jeronimo Greaves M.D. On: 12/06/2021 11:05    Procedures Procedures   Medications Ordered in ED Medications - No data to display  ED Course/ Medical Decision Making/ A&P                           Medical Decision Making Amount and/or Complexity of Data Reviewed Labs: ordered.  Risk Prescription drug management.   66 year old female presents emerged department after positive CT scan showing bilateral pulmonary embolisms.  Will order basic lab and troponin as well as an EKG.  On outside record review via epic, I am able to review the readings from her CT scans her CT chest shows small volume bilateral pulmonary emboli in the right middle and left lower lobes.  There is some pulmonary arterial enlargement suggesting pulmonary arterial hypertension along with some aortic arthrosclerosis, hepatic steatosis and some esophageal air-fluid level suggesting some dysmotility or GERD.   I independently reviewed and interpreted the patient's labs.  Patient has slight hyponatremia at 133 and slightly elevated glucose at 100 although no other electrolyte abnormalities.  Creatinine is 0.98 with a GFR over 60.  Troponin 5.  EKG shows sinus arrhythmia but no signs of heart strain or S1Q3T3.  PESI Score of 64, putting her at Class I, Very Low Risk with a 0-1.6% 30-day mortality in this group. Given the patient's normal kidney function, PESI score, and reassuring troponin and EKG, will give her first dose of  Eliquis here and send her home on that with the VTE starter pack.  Educational packet on Eliquis given to the patient as well with nursing staff to review.  I considered admission, but with the past score being at very low risk of the patient's very well-appearing as well as the chronicity of her shortness of breath, will discharge home.  I discussed with the patient the risks and benefits of a blood thinner.  I discussed with her that I will refill for the first month but she will need to see her cardiologist or her primary care to have additional refills of the Eliquis as she will likely need to be on this anywhere between 3 to 6 months.  My attending recommended that she get a dedicated PE CT study in the future to determine if she has extensive further PEs or if the PEs have resolved after the blood thinner.  I gave strict return precautions on both worsening  PE symptoms as well as things to look out for while starting a new blood thinner or if she were to have any severe abdominal or head trauma to return to the emergency department immediately.  The patient agrees to the plan.  The patient is stable and being discharged home in good condition.  I discussed this case with my attending physician who cosigned this note including patient's presenting symptoms, physical exam, and planned diagnostics and interventions. Attending physician stated agreement with plan or made changes to plan which were implemented.   Final Clinical Impression(s) / ED Diagnoses Final diagnoses:  Multiple subsegmental pulmonary emboli without acute cor pulmonale (HCC)    Rx / DC Orders ED Discharge Orders          Ordered    APIXABAN (ELIQUIS) VTE STARTER PACK (10MG  AND 5MG )        12/06/21 2020              , PA-C 12/09/21 1408    Achille Rich, MD 12/09/21 [phone]

## 2021-12-06 NOTE — ED Triage Notes (Addendum)
First contact with patient. Patient arrived via triage with complaints of shortness of breath x 6 months. Patient states she had a CT scan completed and was informed she has multiple Pe's. Pt is A&OX 4. Respirations even/unlabored at this time - speaking in full sentences.   Patient states she is a very hard stick and is requesting her blood not be taken in triage- only in the back with a ultrasound.

## 2021-12-06 NOTE — Telephone Encounter (Signed)
Results reviewed with pt as per Dr. Kem Parkinson note.  Pt verbalized understanding and had no additional questions. Pt states that she will go to [facility] ED.

## 2021-12-11 ENCOUNTER — Telehealth: Payer: Self-pay | Admitting: Cardiology

## 2021-12-11 NOTE — Telephone Encounter (Signed)
Patient returned call to nurse

## 2022-01-03 ENCOUNTER — Other Ambulatory Visit: Payer: Self-pay

## 2022-01-03 ENCOUNTER — Ambulatory Visit: Payer: No Typology Code available for payment source | Admitting: Nurse Practitioner

## 2022-01-03 ENCOUNTER — Encounter: Payer: Self-pay | Admitting: Nurse Practitioner

## 2022-01-03 VITALS — BP 126/74 | HR 72 | Temp 97.9°F | Ht 63.0 in | Wt 246.0 lb

## 2022-01-03 DIAGNOSIS — E890 Postprocedural hypothyroidism: Secondary | ICD-10-CM | POA: Diagnosis not present

## 2022-01-03 DIAGNOSIS — Z6841 Body Mass Index (BMI) 40.0 and over, adult: Secondary | ICD-10-CM

## 2022-01-03 DIAGNOSIS — F3342 Major depressive disorder, recurrent, in full remission: Secondary | ICD-10-CM

## 2022-01-03 DIAGNOSIS — E782 Mixed hyperlipidemia: Secondary | ICD-10-CM

## 2022-01-03 DIAGNOSIS — I2694 Multiple subsegmental pulmonary emboli without acute cor pulmonale: Secondary | ICD-10-CM

## 2022-01-03 DIAGNOSIS — R7303 Prediabetes: Secondary | ICD-10-CM | POA: Diagnosis not present

## 2022-01-03 DIAGNOSIS — E785 Hyperlipidemia, unspecified: Secondary | ICD-10-CM | POA: Diagnosis not present

## 2022-01-03 MED ORDER — APIXABAN 5 MG PO TABS: 5.0000 mg | ORAL_TABLET | Freq: Two times a day (BID) | ORAL | 1 refills | Status: DC

## 2022-01-03 NOTE — Patient Instructions (Addendum)
Continue on eliquis 5 mg twice ? ?Follow up with cardiologist for heart monitor ? ?Consider hematology referral if no findings to why you had PE.  ? ?

## 2022-01-03 NOTE — Progress Notes (Deleted)
? ? ?Careteam: ?Patient Care Team: ?Lauree Chandler, NP as PCP - General (Nurse Practitioner) ?Amalia Greenhouse, MD as Referring Physician (Endocrinology) ?Hale Bogus., MD as Referring Physician (Gastroenterology) ? ?PLACE OF SERVICE:  ?[facility]  ?Advanced Directive information ?Does Patient Have a Medical Advance Directive?: No, Would patient like information on creating a medical advance directive?: Yes (MAU/Ambulatory/Procedural Areas - Information given) ? ?Allergies  ?Allergen Reactions  ? Lipitor [Atorvastatin] Other (See Comments)  ?  myalgias   ? ? ?Chief Complaint  ?Patient presents with  ? Medical Management of Chronic Issues  ?  3 month follow-up. Discuss need for shingrix, covid boosters, and colonoscopy (per patient updated last year, Dr.Shearin, per patient it was normal) or post pone if patient refuses. NCIR verified. Patient denies receiving any vaccines since last visit.  ?  ? ? ? ?HPI: Patient is a 66 y.o. female *** ? ?Review of Systems:  ?Review of Systems  ?Constitutional:  Negative for chills and fever. *** ? ?Past Medical History:  ?Diagnosis Date  ? Hyperlipidemia   ? Hypertension   ? Thyroid disease   ? Hyperthyroidism  ? ?Past Surgical History:  ?Procedure Laterality Date  ? APPENDECTOMY  11/2017  ? [place]  ?  Darrick Grinder MD  ? [place]  ? Darrick Grinder MD  ? CHOLECYSTECTOMY  1996  ? Volanda Napoleon, MD  ? EYELID LACERATION REPAIR  08/2014  ? ?Social History: ?  reports that she has never smoked. She has never used smokeless tobacco. She reports current alcohol use. She reports that she does not use drugs. ? ?Family History  ?Problem Relation Age of Onset  ? Cancer Mother   ?     Breast  ? Colitis Mother   ? Osteoporosis Mother   ? Diabetes Father   ?     Type 2  ? Cancer Maternal Grandmother   ?     63  ? Cancer Maternal Grandfather   ?     44  ? ? ?Medications: ?Patient's Medications  ?New Prescriptions  ? APIXABAN (ELIQUIS) 5 MG TABS TABLET    Take 1 tablet (5 mg total)  by mouth 2 (two) times daily.  ?Previous Medications  ? CITALOPRAM (CELEXA) 20 MG TABLET    TAKE 1 TABLET BY MOUTH DAILY  ? LEVOTHYROXINE (SYNTHROID) 137 MCG TABLET    TAKE 1 TABLET BY MOUTH DAILY  ?Modified Medications  ? No medications on file  ?Discontinued Medications  ? APIXABAN (ELIQUIS) VTE STARTER PACK (10MG  AND 5MG )    Take as directed on package: start with two-5mg  tablets twice daily for 7 days. On day 8, switch to one-5mg  tablet twice daily.  ? METOPROLOL TARTRATE (LOPRESSOR) 100 MG TABLET    Take 1 tablet (100 mg total) by mouth once for 1 dose. Please take 2 hours before CT  ? ? ?Physical Exam: ? ?Vitals:  ? 01/03/22 1002  ?BP: 126/74  ?Pulse: 72  ?Temp: 97.9 ?F (36.6 ?C)  ?TempSrc: Temporal  ?SpO2: 96%  ?Weight: 246 lb (111.6 kg)  ?Height: 5\' 3"  (1.6 m)  ? ?Body mass index is 43.58 kg/m?. ?Wt Readings from Last 3 Encounters:  ?01/03/22 246 lb (111.6 kg)  ?12/06/21 245 lb (111.1 kg)  ?11/08/21 247 lb (112 kg)  ? ? ?Physical Exam*** ? ?Labs reviewed: ?Basic Metabolic Panel: ?Recent Labs  ?  10/04/21 ?1152 12/05/21 ?1129 12/06/21 ?1027 12/06/21 ?1830  ?NA 139 140  --  133*  ?K 4.3 4.5  --  3.9  ?CL 104 102  --  100  ?CO2 26 21  --  25  ?GLUCOSE 83 90  --  100*  ?BUN 17 16  --  15  ?CREATININE 0.86 0.83 1.00 0.98  ?CALCIUM 9.3 9.3  --  9.4  ?TSH 1.15  --   --   --   ? ?Liver Function Tests: ?Recent Labs  ?  10/04/21 ?1152  ?AST 23  ?ALT 27  ?BILITOT 0.8  ?PROT 6.6  ? ?No results for input(s): LIPASE, AMYLASE in the last 8760 hours. ?No results for input(s): AMMONIA in the last 8760 hours. ?CBC: ?Recent Labs  ?  10/04/21 ?1152  ?WBC 6.8  ?NEUTROABS 2,883  ?HGB 14.0  ?HCT 44.1  ?MCV 89.8  ?PLT 219  ? ?Lipid Panel: ?Recent Labs  ?  10/04/21 ?1152  ?CHOL 209*  ?HDL 73  ?LDLCALC 111*  ?TRIG 141  ?CHOLHDL 2.9  ? ?TSH: ?Recent Labs  ?  10/04/21 ?1152  ?TSH 1.15  ? ?A1C: ?Lab Results  ?Component Value Date  ? HGBA1C 5.7 (H) 10/04/2021  ? ? ? ?Assessment/Plan ?1. Postoperative hypothyroidism ?*** ? ?2. Recurrent  major depressive disorder, in full remission ([place]) ?*** ? ?3. Dyslipidemia ?*** ? ?4. Prediabetes ?*** ? ?5. Class 3 severe obesity due to excess calories with serious comorbidity and body mass index (BMI) of 40.0 to 44.9 in adult [facility]) ?*** ? ?6. Mixed hyperlipidemia ?*** ? ?7. Multiple subsegmental pulmonary emboli without acute cor pulmonale (HCC) ?*** ?- apixaban (ELIQUIS) 5 MG TABS tablet; Take 1 tablet (5 mg total) by mouth 2 (two) times daily.  Dispense: 60 tablet; Refill: 1 ? ? ?Next appt: *** ?Carlos American. Dewaine Oats, AGNP ? ?[facility] ?[phone]  ? ?

## 2022-01-03 NOTE — Progress Notes (Signed)
? ? ?Careteam: ?Patient Care Team: ?Sharon Seller, NP as PCP - General (Nurse Practitioner) ?Izell Cook, MD as Referring Physician (Endocrinology) ?Sydnee Cabal., MD as Referring Physician (Gastroenterology) ? ?PLACE OF SERVICE:  ?[facility]  ?Advanced Directive information ?Does Patient Have a Medical Advance Directive?: No, Would patient like information on creating a medical advance directive?: Yes (MAU/Ambulatory/Procedural Areas - Information given) ? ?Allergies  ?Allergen Reactions  ? Lipitor [Atorvastatin] Other (See Comments)  ?  myalgias   ? ? ?Chief Complaint  ?Patient presents with  ? Medical Management of Chronic Issues  ?  3 month follow-up. Discuss need for shingrix, covid boosters, and colonoscopy (per patient updated last year, Dr.Shearin, per patient it was normal) or post pone if patient refuses. NCIR verified. Patient denies receiving any vaccines since last visit.  ?  ? ? ? ?HPI: Patient is a 66 y.o. female for follow up.  ?She went to cardiologist for evaluation due to shortness of breath and chest discomfort.  ?On CT scan they advised her to go to ED immediately you have PE.  ?She went to ED and they monitored her BP and then they prescribed eliquis and sent her on.  ?She was considered low risk for complications and without progressive symptoms.  ?Reports on the weekend she is more sedentary. She has hx of obesity, no hx of PE or family hx. ?Nonsmoker, ? ?No LE edema, pain or heat.  ? ?Reports her shortness of breath has been unchanged, but she is walking  ? ?Noted to have pulmonary hypertension on CT with minimal CAD.  ? ? ?Review of Systems:  ?Review of Systems  ?Constitutional:  Negative for chills, fever and weight loss.  ?HENT:  Negative for tinnitus.   ?Respiratory:  Positive for shortness of breath (without worsening of symptoms, on exertion). Negative for cough and sputum production.   ?Cardiovascular:  Negative for chest pain, palpitations and leg swelling.   ?Gastrointestinal:  Negative for abdominal pain, constipation, diarrhea and heartburn.  ?Genitourinary:  Negative for dysuria, frequency and urgency.  ?Musculoskeletal:  Negative for back pain, falls, joint pain and myalgias.  ?Skin: Negative.   ?Neurological:  Negative for dizziness and headaches.  ?Psychiatric/Behavioral:  Negative for depression and memory loss. The patient does not have insomnia.   ? ?Past Medical History:  ?Diagnosis Date  ? Hyperlipidemia   ? Hypertension   ? Thyroid disease   ? Hyperthyroidism  ? ?Past Surgical History:  ?Procedure Laterality Date  ? APPENDECTOMY  11/2017  ? CESAREAN SECTION  1990  ?  Antony Blackbird MD  ? CESAREAN SECTION  1993  ? Antony Blackbird MD  ? CHOLECYSTECTOMY  1996  ? Clent Ridges, MD  ? EYELID LACERATION REPAIR  08/2014  ? ?Social History: ?  reports that she has never smoked. She has never used smokeless tobacco. She reports current alcohol use. She reports that she does not use drugs. ? ?Family History  ?Problem Relation Age of Onset  ? Cancer Mother   ?     Breast  ? Colitis Mother   ? Osteoporosis Mother   ? Diabetes Father   ?     Type 2  ? Cancer Maternal Grandmother   ?     50  ? Cancer Maternal Grandfather   ?     25  ? ? ?Medications: ?Patient's Medications  ?New Prescriptions  ? No medications on file  ?Previous Medications  ? APIXABAN (ELIQUIS) VTE STARTER PACK (10MG  AND 5MG )    Take  as directed on package: start with two-5mg  tablets twice daily for 7 days. On day 8, switch to one-5mg  tablet twice daily.  ? CITALOPRAM (CELEXA) 20 MG TABLET    TAKE 1 TABLET BY MOUTH DAILY  ? LEVOTHYROXINE (SYNTHROID) 137 MCG TABLET    TAKE 1 TABLET BY MOUTH DAILY  ?Modified Medications  ? No medications on file  ?Discontinued Medications  ? METOPROLOL TARTRATE (LOPRESSOR) 100 MG TABLET    Take 1 tablet (100 mg total) by mouth once for 1 dose. Please take 2 hours before CT  ? ? ?Physical Exam: ? ?Vitals:  ? 01/03/22 1002  ?BP: 126/74  ?Pulse: 72  ?Temp: 97.9 ?F (36.6 ?C)  ?TempSrc: Temporal   ?SpO2: 96%  ?Weight: 246 lb (111.6 kg)  ?Height: 5\' 3"  (1.6 m)  ? ?Body mass index is 43.58 kg/m?. ?Wt Readings from Last 3 Encounters:  ?01/03/22 246 lb (111.6 kg)  ?12/06/21 245 lb (111.1 kg)  ?11/08/21 247 lb (112 kg)  ? ? ?Physical Exam ?Constitutional:   ?   General: She is not in acute distress. ?   Appearance: She is well-developed. She is not diaphoretic.  ?HENT:  ?   Head: Normocephalic and atraumatic.  ?   Mouth/Throat:  ?   Pharynx: No oropharyngeal exudate.  ?Eyes:  ?   Conjunctiva/sclera: Conjunctivae normal.  ?   Pupils: Pupils are equal, round, and reactive to light.  ?Cardiovascular:  ?   Rate and Rhythm: Normal rate and regular rhythm.  ?   Heart sounds: Normal heart sounds.  ?Pulmonary:  ?   Effort: Pulmonary effort is normal.  ?   Breath sounds: Normal breath sounds.  ?Abdominal:  ?   General: Bowel sounds are normal.  ?   Palpations: Abdomen is soft.  ?Musculoskeletal:     ?   General: No swelling or tenderness.  ?   Cervical back: Normal range of motion and neck supple.  ?   Right lower leg: No edema.  ?   Left lower leg: No edema.  ?Skin: ?   General: Skin is warm and dry.  ?Neurological:  ?   Mental Status: She is alert and oriented to person, place, and time. Mental status is at baseline.  ?Psychiatric:     ?   Mood and Affect: Mood normal.  ? ? ?Labs reviewed: ?Basic Metabolic Panel: ?Recent Labs  ?  10/04/21 ?1152 12/05/21 ?1129 12/06/21 ?1027 12/06/21 ?1830  ?NA 139 140  --  133*  ?K 4.3 4.5  --  3.9  ?CL 104 102  --  100  ?CO2 26 21  --  25  ?GLUCOSE 83 90  --  100*  ?BUN 17 16  --  15  ?CREATININE 0.86 0.83 1.00 0.98  ?CALCIUM 9.3 9.3  --  9.4  ?TSH 1.15  --   --   --   ? ?Liver Function Tests: ?Recent Labs  ?  10/04/21 ?1152  ?AST 23  ?ALT 27  ?BILITOT 0.8  ?PROT 6.6  ? ?No results for input(s): LIPASE, AMYLASE in the last 8760 hours. ?No results for input(s): AMMONIA in the last 8760 hours. ?CBC: ?Recent Labs  ?  10/04/21 ?1152  ?WBC 6.8  ?NEUTROABS 2,883  ?HGB 14.0  ?HCT 44.1  ?MCV  89.8  ?PLT 219  ? ?Lipid Panel: ?Recent Labs  ?  10/04/21 ?1152  ?CHOL 209*  ?HDL 73  ?LDLCALC 111*  ?TRIG 141  ?CHOLHDL 2.9  ? ?TSH: ?Recent Labs  ?  10/04/21 ?1152  ?TSH 1.15  ? ?A1C: ?Lab Results  ?Component Value Date  ? HGBA1C 5.7 (H) 10/04/2021  ? ? ? ?Assessment/Plan ?1. Postoperative hypothyroidism ?-controlled on synthroid.  ? ?2. Recurrent major depressive disorder, in full remission (HCC) ?-anxious over recent diagnosis of bilateral PE but overall controlled.  ? ?3. Dyslipidemia ?- LDL slightly elevated to 111, have not been working on modifications on diet but plans to.  ? ?4. Prediabetes ?-A1c 5.7 in December, continue to work on diet.  ? ?5. Class 3 severe obesity due to excess calories with serious comorbidity and body mass index (BMI) of 40.0 to 44.9 in adult [facility]) ?--education provided on healthy weight loss through increase in physical activity and proper nutrition  ? ?8. Multiple subsegmental pulmonary emboli without acute cor pulmonale (HCC) ?-noted on coronary CT, sent to ED for evaluation. Without worsening shortness of breath or chest pain. She noted that tech noted she was having some a fib during exam (do not see documentation of this, would benefit from monitor). She is following up with her cardiologist on this next week.  ?Discuss if no a fib, to send to hematology for further evaluation. She was in SR on exam today. Sinus on EKG from ED visit.  ? ? ?Return in about 3 months (around 04/05/2022) for routine follow up. . Sooner if needed ?Janene Harvey. Janyth Contes, AGNP ? ?[facility] ?[phone]  ?

## 2022-01-10 ENCOUNTER — Other Ambulatory Visit: Payer: Self-pay

## 2022-01-10 ENCOUNTER — Encounter: Payer: Self-pay | Admitting: Cardiology

## 2022-01-10 ENCOUNTER — Ambulatory Visit (INDEPENDENT_AMBULATORY_CARE_PROVIDER_SITE_OTHER): Payer: No Typology Code available for payment source

## 2022-01-10 ENCOUNTER — Ambulatory Visit (INDEPENDENT_AMBULATORY_CARE_PROVIDER_SITE_OTHER): Payer: No Typology Code available for payment source | Admitting: Cardiology

## 2022-01-10 VITALS — BP 178/94 | HR 93 | Ht 63.0 in | Wt 245.0 lb

## 2022-01-10 DIAGNOSIS — I480 Paroxysmal atrial fibrillation: Secondary | ICD-10-CM

## 2022-01-10 DIAGNOSIS — R0609 Other forms of dyspnea: Secondary | ICD-10-CM | POA: Diagnosis not present

## 2022-01-10 DIAGNOSIS — E785 Hyperlipidemia, unspecified: Secondary | ICD-10-CM | POA: Diagnosis not present

## 2022-01-10 DIAGNOSIS — Z86711 Personal history of pulmonary embolism: Secondary | ICD-10-CM | POA: Insufficient documentation

## 2022-01-10 MED ORDER — ATORVASTATIN CALCIUM 10 MG PO TABS: 10.0000 mg | ORAL_TABLET | Freq: Every day | ORAL | 3 refills | Status: DC

## 2022-01-10 MED ORDER — ROSUVASTATIN CALCIUM 5 MG PO TABS: 5.0000 mg | ORAL_TABLET | Freq: Every day | ORAL | 3 refills | Status: DC

## 2022-01-10 NOTE — Progress Notes (Signed)
?Cardiology Office Note:   ? ?Date:  01/10/2022  ? ?ID:  Kristen Moran, DOB 1956-04-20, MRN [id number] ? ?PCP:  Sharon Seller, NP  ?Cardiologist:  Gypsy Balsam, MD   ? ?Referring MD: Sharon Seller, NP  ? ?Chief Complaint  ?Patient presents with  ? Results  ?  CT  ? ? ?History of Present Illness:   ? ?Kristen Moran is a 66 y.o. female who was referred to [place] initially because of dyspnea on exertion.  She did have some COVID infection after that she was short of breath.  Echocardiogram showed preserved left ventricle ejection fraction, however coronary CT angio revealed presence of pulmonary emboli.  She was initiated with anticoagulation she comes today to my office for follow-up.  She is doing better but shortness of breath and fatigue is still there interestingly she was told when she was having coronary CT angio that she got atrial fibrillation which is completely new discovery.  I am looking for documentation of it.  Her coronary CT angio showed mild nonobstructive disease involving LAD and circumflex.  Her calcium score was 6.8 only. ? ?Past Medical History:  ?Diagnosis Date  ? Hyperlipidemia   ? Hypertension   ? Thyroid disease   ? Hyperthyroidism  ? ? ?Past Surgical History:  ?Procedure Laterality Date  ? APPENDECTOMY  11/2017  ? CESAREAN SECTION  1990  ?  Antony Blackbird MD  ? CESAREAN SECTION  1993  ? Antony Blackbird MD  ? CHOLECYSTECTOMY  1996  ? Clent Ridges, MD  ? EYELID LACERATION REPAIR  08/2014  ? ? ?Current Medications: ?Current Meds  ?Medication Sig  ? apixaban (ELIQUIS) 5 MG TABS tablet Take 1 tablet (5 mg total) by mouth 2 (two) times daily.  ? citalopram (CELEXA) 20 MG tablet TAKE 1 TABLET BY MOUTH DAILY (Patient taking differently: Take 20 mg by mouth daily.)  ? levothyroxine (SYNTHROID) 137 MCG tablet TAKE 1 TABLET BY MOUTH DAILY (Patient taking differently: Take 137 mcg by mouth daily before breakfast. TAKE 1 TABLET BY MOUTH DAILY)  ?  ? ?Allergies:   Lipitor [atorvastatin]  ? ?Social History   ? ?Socioeconomic History  ? Marital status: Legally Separated  ?  Spouse name: Not on file  ? Number of children: Not on file  ? Years of education: Not on file  ? Highest education level: Not on file  ?Occupational History  ? Not on file  ?Tobacco Use  ? Smoking status: Never  ? Smokeless tobacco: Never  ?Vaping Use  ? Vaping Use: Never used  ?Substance and Sexual Activity  ? Alcohol use: Yes  ?  Alcohol/week: 0.0 standard drinks  ?  Comment: 1-2 a month  ? Drug use: No  ? Sexual activity: Not Currently  ?Other Topics Concern  ? Not on file  ?Social History Narrative  ? Not on file  ? ?Social Determinants of Health  ? ?Financial Resource Strain: Not on file  ?Food Insecurity: Not on file  ?Transportation Needs: Not on file  ?Physical Activity: Not on file  ?Stress: Not on file  ?Social Connections: Not on file  ?  ? ?Family History: ?The patient's family history includes Cancer in her maternal grandfather, maternal grandmother, and mother; Colitis in her mother; Diabetes in her father; Osteoporosis in her mother. ?ROS:   ?Please see the history of present illness.    ?All 14 point review of systems negative except as described per history of present illness ? ?EKGs/Labs/Other Studies Reviewed:   ? ? ? ?  Recent Labs: ?10/04/2021: ALT 27; Hemoglobin 14.0; Platelets 219; TSH 1.15 ?12/06/2021: BUN 15; Creatinine, Ser 0.98; Potassium 3.9; Sodium 133  ?Recent Lipid Panel ?   ?Component Value Date/Time  ? CHOL 209 (H) 10/04/2021 1152  ? CHOL 202 (H) 11/28/2015 0959  ? TRIG 141 10/04/2021 1152  ? HDL 73 10/04/2021 1152  ? HDL 82 11/28/2015 0959  ? CHOLHDL 2.9 10/04/2021 1152  ? VLDL 23 05/15/2017 0854  ? LDLCALC 111 (H) 10/04/2021 1152  ? ? ?Physical Exam:   ? ?VS:  BP (!) 178/94 (BP Location: Right Arm, Patient Position: Sitting)   Pulse 93   Ht 5\' 3"  (1.6 m)   Wt 245 lb (111.1 kg)   SpO2 95%   BMI 43.40 kg/m?    ? ?Wt Readings from Last 3 Encounters:  ?01/10/22 245 lb (111.1 kg)  ?01/03/22 246 lb (111.6 kg)   ?12/06/21 245 lb (111.1 kg)  ?  ? ?GEN:  Well nourished, well developed in no acute distress ?HEENT: Normal ?NECK: No JVD; No carotid bruits ?LYMPHATICS: No lymphadenopathy ?CARDIAC: RRR, no murmurs, no rubs, no gallops ?RESPIRATORY:  Clear to auscultation without rales, wheezing or rhonchi  ?ABDOMEN: Soft, non-tender, non-distended ?MUSCULOSKELETAL:  No edema; No deformity  ?SKIN: Warm and dry ?LOWER EXTREMITIES: no swelling ?NEUROLOGIC:  Alert and oriented x 3 ?PSYCHIATRIC:  Normal affect  ? ?ASSESSMENT:   ? ?1. Dyslipidemia   ?2. History of pulmonary embolism   ?3. Paroxysmal atrial fibrillation (HCC)   ?4. Dyspnea on exertion   ? ?PLAN:   ? ?In order of problems listed above: ? ?Pulmonary emboli.  She is anticoagulated which I will continue.  It is possible that pulmonary emboli was related to her COVID-19 infection however if we also talking about potentially having paroxysmal atrial fibrillation she need to be anticoagulated and definitely.  I explained to her this issue.  Overall she is doing better but still a bit fatigued and short of breath. ?Paroxysmal atrial fibrillation we will put Zio patch we will do EKG today.  We will continue anticoagulation. ?Coronary artery disease which is nonobstructive risk factors modifications. ?Dyslipidemia I did review her fasting lipid profile from 3 months ago LDL of 111 HDL 73 I recommend to start Lipitor 10 daily. ? ? ?Medication Adjustments/Labs and Tests Ordered: ?Current medicines are reviewed at length with the patient today.  Concerns regarding medicines are outlined above.  ?No orders of the defined types were placed in this encounter. ? ?Medication changes: No orders of the defined types were placed in this encounter. ? ? ?Signed, ?12/08/21, MD, [facility] ?01/10/2022 9:46 AM    ?[facility] ?

## 2022-01-10 NOTE — Patient Instructions (Addendum)
Medication Instructions:  ?Your physician has recommended you make the following change in your medication:  ? ?START: Crestor 5 mg daily ? ?*If you need a refill on your cardiac medications before your next appointment, please call your pharmacy* ? ? ?Lab Work: ?Your physician recommends that you return for lab work in:  ? ?Labs in 6 weeks: Lipids, LFT's ? ?If you have labs (blood work) drawn today and your tests are completely normal, you will receive your results only by: ?MyChart Message (if you have MyChart) OR ?A paper copy in the mail ?If you have any lab test that is abnormal or we need to change your treatment, we will call you to review the results. ? ? ?Testing/Procedures: ?A zio monitor was ordered today. It will remain on for 14 days. You will then return monitor and event diary in provided box. It takes 1-2 weeks for report to be downloaded and returned to [place]. We will call you with the results. If monitor falls off or has orange flashing light, please call Zio for further instructions.  ? ? ? ?Follow-Up: ?At [facility], you and your health needs are our priority.  As part of our continuing mission to provide you with exceptional heart care, we have created designated Provider Care Teams.  These Care Teams include your primary Cardiologist (physician) and Advanced Practice Providers (APPs -  Physician Assistants and Nurse Practitioners) who all work together to provide you with the care you need, when you need it. ? ?We recommend signing up for the patient portal called "MyChart".  Sign up information is provided on this After Visit Summary.  MyChart is used to connect with patients for Virtual Visits (Telemedicine).  Patients are able to view lab/test results, encounter notes, upcoming appointments, etc.  Non-urgent messages can be sent to your provider as well.   ?To learn more about what you can do with MyChart, go to [email or web address].   ? ?Your next appointment:   ?3 month(s) ? ?The  format for your next appointment:   ?In Person ? ?Provider:   ?Jenne Campus, MD  ? ? ?Other Instructions ?None ? ?

## 2022-02-21 ENCOUNTER — Telehealth: Payer: Self-pay

## 2022-02-21 MED ORDER — METOPROLOL TARTRATE 25 MG PO TABS: 25.0000 mg | ORAL_TABLET | Freq: Two times a day (BID) | ORAL | 0 refills | Status: DC

## 2022-02-21 NOTE — Telephone Encounter (Signed)
-----   Message from Georgeanna Lea, MD sent at 02/19/2022  9:17 PM EDT ----- ?Monitor showed multiple episode of supraventricular tachycardia some symptomatic, please start metoprolol titrate 25 twice daily ?

## 2022-02-21 NOTE — Telephone Encounter (Signed)
Patient notified of results and recommendations and agreed with plan. Rx sent.  

## 2022-02-22 LAB — LIPID PANEL
Chol/HDL Ratio: 1.8 ratio (ref 0.0–4.4)
Cholesterol, Total: 149 mg/dL (ref 100–199)
HDL: 83 mg/dL (ref >39)
LDL Chol Calc (NIH): 43 mg/dL (ref 0–99)
Triglycerides: 138 mg/dL (ref 0–149)
VLDL Cholesterol Cal: 23 mg/dL (ref 5–40)

## 2022-02-22 LAB — HEPATIC FUNCTION PANEL
ALT: 28 IU/L (ref 0–32)
AST: 22 IU/L (ref 0–40)
Albumin: 4.3 g/dL (ref 3.8–4.8)
Alkaline Phosphatase: 99 IU/L (ref 44–121)
Bilirubin Total: 0.5 mg/dL (ref 0.0–1.2)
Bilirubin, Direct: 0.18 mg/dL (ref 0.00–0.40)
Total Protein: 6.6 g/dL (ref 6.0–8.5)

## 2022-03-08 ENCOUNTER — Other Ambulatory Visit: Payer: Self-pay | Admitting: Nurse Practitioner

## 2022-03-08 DIAGNOSIS — I2694 Multiple subsegmental pulmonary emboli without acute cor pulmonale: Secondary | ICD-10-CM

## 2022-03-13 ENCOUNTER — Other Ambulatory Visit: Payer: Self-pay | Admitting: Nurse Practitioner

## 2022-03-13 DIAGNOSIS — F32A Depression, unspecified: Secondary | ICD-10-CM

## 2022-04-04 ENCOUNTER — Encounter: Payer: Self-pay | Admitting: Nurse Practitioner

## 2022-04-04 ENCOUNTER — Ambulatory Visit: Payer: No Typology Code available for payment source | Admitting: Nurse Practitioner

## 2022-04-04 VITALS — BP 132/70 | HR 71 | Temp 97.9°F | Resp 18 | Ht 63.0 in | Wt 249.4 lb

## 2022-04-04 DIAGNOSIS — Z6841 Body Mass Index (BMI) 40.0 and over, adult: Secondary | ICD-10-CM

## 2022-04-04 DIAGNOSIS — R7303 Prediabetes: Secondary | ICD-10-CM

## 2022-04-04 DIAGNOSIS — E782 Mixed hyperlipidemia: Secondary | ICD-10-CM

## 2022-04-04 DIAGNOSIS — E890 Postprocedural hypothyroidism: Secondary | ICD-10-CM

## 2022-04-04 DIAGNOSIS — G4733 Obstructive sleep apnea (adult) (pediatric): Secondary | ICD-10-CM

## 2022-04-04 DIAGNOSIS — I480 Paroxysmal atrial fibrillation: Secondary | ICD-10-CM

## 2022-04-04 MED ORDER — ROSUVASTATIN CALCIUM 5 MG PO TABS: 5.0000 mg | ORAL_TABLET | Freq: Every day | ORAL | 3 refills | Status: AC

## 2022-04-04 NOTE — Patient Instructions (Addendum)
Please contact your local pharmacy, previous provider, or insurance carrier for vaccine/immunization records. Ensure that any procedures done outside of [facility] are faxed to [place] [phone] or you can sign release of records form at the front desk to keep your medical record updated.     Make sure you are taking crestor 5 mg daily

## 2022-04-04 NOTE — Progress Notes (Unsigned)
Careteam: Patient Care Team: Sharon Seller, NP as PCP - General (Nurse Practitioner) Izell Glenwood, MD as Referring Physician (Endocrinology) Sydnee Cabal., MD as Referring Physician (Gastroenterology)  PLACE OF SERVICE:  [facility]  Advanced Directive information Does Patient Have a Medical Advance Directive?: No, Would patient like information on creating a medical advance directive?: No - Patient declined  Allergies  Allergen Reactions  . Lipitor [Atorvastatin] Other (See Comments)    myalgias     Chief Complaint  Patient presents with  . Medical Management of Chronic Issues    3 month follow up.  . Immunizations    Discuss the need for Shingrix vaccine, and Covid Booster.   . Concern    Moderate Fall Risk     HPI: Patient is a 66 y.o. female for routine follow up.   She worse zio patch per cardiologist. Found to have SVT and a fib.  She is tired.  She has gained 3 lbs.   OSA- diagnosised in 2020, could not tolerate cpap but willing to give this a try again due to fatigue and now having a fib.     Review of Systems:  ROS***  Past Medical History:  Diagnosis Date  . Hyperlipidemia   . Hypertension   . Thyroid disease    Hyperthyroidism   Past Surgical History:  Procedure Laterality Date  . APPENDECTOMY  11/2017  . CESAREAN SECTION  1990    Antony Blackbird MD  . CESAREAN SECTION  1993   Antony Blackbird MD  . CHOLECYSTECTOMY  1996   Clent Ridges, MD  . EYELID LACERATION REPAIR  08/2014   Social History:   reports that she has never smoked. She has never used smokeless tobacco. She reports current alcohol use. She reports that she does not use drugs.  Family History  Problem Relation Age of Onset  . Cancer Mother        Breast  . Colitis Mother   . Osteoporosis Mother   . Diabetes Father        Type 2  . Cancer Maternal Grandmother        60  . Cancer Maternal Grandfather        38    Medications: Patient's Medications  New Prescriptions   No  medications on file  Previous Medications   APIXABAN (ELIQUIS) 5 MG TABS TABLET    TAKE 1 TABLET(5 MG) BY MOUTH TWICE DAILY   CITALOPRAM (CELEXA) 20 MG TABLET    TAKE 1 TABLET BY MOUTH DAILY   LEVOTHYROXINE (SYNTHROID) 137 MCG TABLET    TAKE 1 TABLET BY MOUTH DAILY   METOPROLOL TARTRATE (LOPRESSOR) 25 MG TABLET    Take 1 tablet (25 mg total) by mouth 2 (two) times daily.  Modified Medications   No medications on file  Discontinued Medications   ROSUVASTATIN (CRESTOR) 5 MG TABLET    Take 1 tablet (5 mg total) by mouth daily.    Physical Exam:  Vitals:   04/04/22 1310  BP: 132/70  Pulse: 71  Resp: 18  Temp: 97.9 F (36.6 C)  SpO2: 97%  Weight: 249 lb 6.4 oz (113.1 kg)  Height: 5\' 3"  (1.6 m)   Body mass index is 44.18 kg/m. Wt Readings from Last 3 Encounters:  04/04/22 249 lb 6.4 oz (113.1 kg)  01/10/22 245 lb (111.1 kg)  01/03/22 246 lb (111.6 kg)    Physical Exam***  Labs reviewed: Basic Metabolic Panel: Recent Labs    10/04/21 1152 12/05/21 1129  12/06/21 1027 12/06/21 1830  NA 139 140  --  133*  K 4.3 4.5  --  3.9  CL 104 102  --  100  CO2 26 21  --  25  GLUCOSE 83 90  --  100*  BUN 17 16  --  15  CREATININE 0.86 0.83 1.00 0.98  CALCIUM 9.3 9.3  --  9.4  TSH 1.15  --   --   --    Liver Function Tests: Recent Labs    10/04/21 1152 02/21/22 0917  AST 23 22  ALT 27 28  ALKPHOS  --  99  BILITOT 0.8 0.5  PROT 6.6 6.6  ALBUMIN  --  4.3   No results for input(s): "LIPASE", "AMYLASE" in the last 8760 hours. No results for input(s): "AMMONIA" in the last 8760 hours. CBC: Recent Labs    10/04/21 1152  WBC 6.8  NEUTROABS 2,883  HGB 14.0  HCT 44.1  MCV 89.8  PLT 219   Lipid Panel: Recent Labs    10/04/21 1152 02/21/22 0917  CHOL 209* 149  HDL 73 83  LDLCALC 111* 43  TRIG 141 138  CHOLHDL 2.9 1.8   TSH: Recent Labs    10/04/21 1152  TSH 1.15   A1C: Lab Results  Component Value Date   HGBA1C 5.7 (H) 10/04/2021      Assessment/Plan There are no diagnoses linked to this encounter.  No follow-ups on file.: *** Bonnita Newby K. Biagio Borg  [facility] [phone]

## 2022-04-05 LAB — COMPLETE METABOLIC PANEL WITH GFR
AG Ratio: 1.7 (calc) (ref 1.0–2.5)
ALT: 26 U/L (ref 6–29)
AST: 24 U/L (ref 10–35)
Albumin: 4.1 g/dL (ref 3.6–5.1)
Alkaline phosphatase (APISO): 87 U/L (ref 37–153)
BUN: 19 mg/dL (ref 7–25)
CO2: 25 mmol/L (ref 20–32)
Calcium: 9.9 mg/dL (ref 8.6–10.4)
Chloride: 104 mmol/L (ref 98–110)
Creat: 0.93 mg/dL (ref 0.50–1.05)
Globulin: 2.4 g/dL (calc) (ref 1.9–3.7)
Glucose, Bld: 86 mg/dL (ref 65–139)
Potassium: 4.6 mmol/L (ref 3.5–5.3)
Sodium: 138 mmol/L (ref 135–146)
Total Bilirubin: 0.7 mg/dL (ref 0.2–1.2)
Total Protein: 6.5 g/dL (ref 6.1–8.1)
eGFR: 69 mL/min/{1.73_m2} (ref >=60)

## 2022-04-05 LAB — CBC WITH DIFFERENTIAL/PLATELET
Absolute Monocytes: 670 cells/uL (ref 200–950)
Basophils Absolute: 54 cells/uL (ref 0–200)
Basophils Relative: 0.7 %
Eosinophils Absolute: 139 cells/uL (ref 15–500)
Eosinophils Relative: 1.8 %
HCT: 41.5 % (ref 35.0–45.0)
Hemoglobin: 13.5 g/dL (ref 11.7–15.5)
Lymphs Abs: 3488 cells/uL (ref 850–3900)
MCH: 29 pg (ref 27.0–33.0)
MCHC: 32.5 g/dL (ref 32.0–36.0)
MCV: 89.1 fL (ref 80.0–100.0)
MPV: 11.4 fL (ref 7.5–12.5)
Monocytes Relative: 8.7 %
Neutro Abs: 3350 cells/uL (ref 1500–7800)
Neutrophils Relative %: 43.5 %
Platelets: 211 10*3/uL (ref 140–400)
RBC: 4.66 10*6/uL (ref 3.80–5.10)
RDW: 12.4 % (ref 11.0–15.0)
Total Lymphocyte: 45.3 %
WBC: 7.7 10*3/uL (ref 3.8–10.8)

## 2022-04-05 LAB — HEMOGLOBIN A1C
Hgb A1c MFr Bld: 5.7 % of total Hgb — ABNORMAL HIGH (ref <5.7)
Mean Plasma Glucose: 117 mg/dL
eAG (mmol/L): 6.5 mmol/L

## 2022-04-05 LAB — TSH: TSH: 0.38 mIU/L — ABNORMAL LOW (ref 0.40–4.50)

## 2022-04-07 ENCOUNTER — Other Ambulatory Visit: Payer: Self-pay

## 2022-04-07 DIAGNOSIS — E890 Postprocedural hypothyroidism: Secondary | ICD-10-CM

## 2022-04-18 ENCOUNTER — Encounter: Payer: Self-pay | Admitting: Cardiology

## 2022-04-18 ENCOUNTER — Ambulatory Visit: Payer: No Typology Code available for payment source | Admitting: Cardiology

## 2022-04-18 VITALS — BP 124/80 | HR 57 | Ht 64.0 in | Wt 249.6 lb

## 2022-04-18 DIAGNOSIS — I480 Paroxysmal atrial fibrillation: Secondary | ICD-10-CM | POA: Diagnosis not present

## 2022-04-18 DIAGNOSIS — Z86711 Personal history of pulmonary embolism: Secondary | ICD-10-CM

## 2022-04-18 DIAGNOSIS — R7303 Prediabetes: Secondary | ICD-10-CM | POA: Diagnosis not present

## 2022-04-18 DIAGNOSIS — Z8616 Personal history of COVID-19: Secondary | ICD-10-CM

## 2022-04-18 DIAGNOSIS — E785 Hyperlipidemia, unspecified: Secondary | ICD-10-CM

## 2022-04-18 MED ORDER — DILTIAZEM HCL ER COATED BEADS 120 MG PO CP24: 120.0000 mg | ORAL_CAPSULE | Freq: Every day | ORAL | 0 refills | Status: DC

## 2022-04-18 NOTE — Progress Notes (Signed)
Cardiology Office Note:    Date:  04/18/2022   ID:  Kristen, Moran 12-18-1955, MRN [id number]  PCP:  Kristen Seller, NP  Cardiologist:  Kristen Balsam, Moran    Referring Moran: Kristen Seller, NP   Chief Complaint  Patient presents with   Follow-up    Afib sx's     History of Present Illness:    Kristen Moran is a 66 y.o. female with past medical history significant for COVID-19 infection, after that she end up having pulmonary emboli, also essential hypertension dyslipidemia paroxysmal atrial fibrillation. She comes today to my office to discuss finding of her tests.  She did wear a monitor monitor show episode of supraventricular tachycardia but no atrial fibrillation.  I put her on a small dose of beta-blocker metoprolol 25 twice daily she feels better but still have some breakthrough arrhythmia.  Also concern is about her being bradycardic with her heart rate on the EKG today of 61.  Denies have any chest pain tightness squeezing pressure burning chest.  She started to be a little more active started exercising on the regular basis.  No dizziness no passing out no syncope no chest pain.  Past Medical History:  Diagnosis Date   Hyperlipidemia    Hypertension    Thyroid disease    Hyperthyroidism    Past Surgical History:  Procedure Laterality Date   APPENDECTOMY  11/2017   CESAREAN SECTION  1990    Kristen Moran   CESAREAN SECTION  1993   Everett Moran   CHOLECYSTECTOMY  1996   Kristen Ridges, Moran   EYELID LACERATION REPAIR  08/2014    Current Medications: Current Meds  Medication Sig   apixaban (ELIQUIS) 5 MG TABS tablet TAKE 1 TABLET(5 MG) BY MOUTH TWICE DAILY (Patient taking differently: Take 5 mg by mouth 2 (two) times daily.)   citalopram (CELEXA) 20 MG tablet TAKE 1 TABLET BY MOUTH DAILY (Patient taking differently: Take 20 mg by mouth daily.)   diltiazem (CARDIZEM CD) 120 MG 24 hr capsule Take 1 capsule (120 mg total) by mouth daily.   levothyroxine  (SYNTHROID) 137 MCG tablet TAKE 1 TABLET BY MOUTH DAILY (Patient taking differently: Take 137 mcg by mouth daily before breakfast. TAKE 1 TABLET BY MOUTH DAILY)   metoprolol tartrate (LOPRESSOR) 25 MG tablet Take 1 tablet (25 mg total) by mouth 2 (two) times daily.   rosuvastatin (CRESTOR) 5 MG tablet Take 1 tablet (5 mg total) by mouth daily.     Allergies:   Lipitor [atorvastatin]   Social History   Socioeconomic History   Marital status: Legally Separated    Spouse name: Not on file   Number of children: Not on file   Years of education: Not on file   Highest education level: Not on file  Occupational History   Not on file  Tobacco Use   Smoking status: Never   Smokeless tobacco: Never  Vaping Use   Vaping Use: Never used  Substance and Sexual Activity   Alcohol use: Yes    Comment: occasion   Drug use: No   Sexual activity: Not Currently  Other Topics Concern   Not on file  Social History Narrative   Not on file   Social Determinants of Health   Financial Resource Strain: Not on file  Food Insecurity: Not on file  Transportation Needs: Not on file  Physical Activity: Not on file  Stress: Not on file  Social Connections: Not on file  Family History: The patient's family history includes Cancer in her maternal grandfather, maternal grandmother, and mother; Colitis in her mother; Diabetes in her father; Osteoporosis in her mother. ROS:   Please see the history of present illness.    All 14 point review of systems negative except as described per history of present illness  EKGs/Labs/Other Studies Reviewed:      Recent Labs: 04/04/2022: ALT 26; BUN 19; Creat 0.93; Hemoglobin 13.5; Platelets 211; Potassium 4.6; Sodium 138; TSH 0.38  Recent Lipid Panel    Component Value Date/Time   CHOL 149 02/21/2022 0917   TRIG 138 02/21/2022 0917   HDL 83 02/21/2022 0917   CHOLHDL 1.8 02/21/2022 0917   CHOLHDL 2.9 10/04/2021 1152   VLDL 23 05/15/2017 0854   LDLCALC  43 02/21/2022 0917   LDLCALC 111 (H) 10/04/2021 1152    Physical Exam:    VS:  BP 124/80 (BP Location: Left Arm, Patient Position: Sitting)   Pulse (!) 57   Ht 5\' 4"  (1.626 m)   Wt 249 lb 9.6 oz (113.2 kg)   SpO2 97%   BMI 42.84 kg/m     Wt Readings from Last 3 Encounters:  04/18/22 249 lb 9.6 oz (113.2 kg)  04/04/22 249 lb 6.4 oz (113.1 kg)  01/10/22 245 lb (111.1 kg)     GEN:  Well nourished, well developed in no acute distress HEENT: Normal NECK: No JVD; No carotid bruits LYMPHATICS: No lymphadenopathy CARDIAC: RRR, no murmurs, no rubs, no gallops RESPIRATORY:  Clear to auscultation without rales, wheezing or rhonchi  ABDOMEN: Soft, non-tender, non-distended MUSCULOSKELETAL:  No edema; No deformity  SKIN: Warm and dry LOWER EXTREMITIES: no swelling NEUROLOGIC:  Alert and oriented x 3 PSYCHIATRIC:  Normal affect   ASSESSMENT:    1. Paroxysmal atrial fibrillation (HCC)   2. History of pulmonary embolism   3. History of COVID-19   4. Prediabetes   5. Dyslipidemia    PLAN:    In order of problems listed above:  History of pulmonary emboli.  She is anticoagulant which I continue.  I think we have to that reason for anticoagulation while his pulmonary emboli and second supposedly history of atrial fibrillation.  And I did put monitor on her I was unable to document atrial fibrillation but looking at the echocardiogram with significantly enlarged atria's I am worried she may have this arrhythmia in terms of supraventricular tachycardia which seems to be a new diagnosis.  I will initiate therapy with calcium channel blocker.  She is already on beta-blocker small dose because of resting bradycardia hopefully addition of Cardizem CD 120 will help.  We will continue anticoagulation History of PE continue anticoagulation Prediabetes we did talk about need to exercise on the regular basis weight loss management and good diet. Dyslipidemia she is on Crestor 5 which I will  continue I did review her K PN which show me data from May 2023 with LDL of 43 HDL 83 we will continue present management   Medication Adjustments/Labs and Tests Ordered: Current medicines are reviewed at length with the patient today.  Concerns regarding medicines are outlined above.  Orders Placed This Encounter  Procedures   EKG 12-Lead   Medication changes:  Meds ordered this encounter  Medications   diltiazem (CARDIZEM CD) 120 MG 24 hr capsule    Sig: Take 1 capsule (120 mg total) by mouth daily.    Dispense:  90 capsule    Refill:  0    Signed, June 2023  Abelino Derrick, Moran, [facility] 04/18/2022 11:57 AM    [facility]

## 2022-04-18 NOTE — Patient Instructions (Signed)
Medication Instructions:  Your physician has recommended you make the following change in your medication:  Start Cardizem CD 120 mg once daily  *If you need a refill on your cardiac medications before your next appointment, please call your pharmacy*   Lab Work: NONE If you have labs (blood work) drawn today and your tests are completely normal, you will receive your results only by: MyChart Message (if you have MyChart) OR A paper copy in the mail If you have any lab test that is abnormal or we need to change your treatment, we will call you to review the results.   Testing/Procedures: NONE   Follow-Up: At [facility], you and your health needs are our priority.  As part of our continuing mission to provide you with exceptional heart care, we have created designated Provider Care Teams.  These Care Teams include your primary Cardiologist (physician) and Advanced Practice Providers (APPs -  Physician Assistants and Nurse Practitioners) who all work together to provide you with the care you need, when you need it.  We recommend signing up for the patient portal called "MyChart".  Sign up information is provided on this After Visit Summary.  MyChart is used to connect with patients for Virtual Visits (Telemedicine).  Patients are able to view lab/test results, encounter notes, upcoming appointments, etc.  Non-urgent messages can be sent to your provider as well.   To learn more about what you can do with MyChart, go to [email or web address].    Your next appointment:   5 month(s)  The format for your next appointment:   In Person  Provider:   Gypsy Balsam, MD    Other Instructions   Important Information About Sugar

## 2022-04-27 ENCOUNTER — Other Ambulatory Visit: Payer: Self-pay | Admitting: Nurse Practitioner

## 2022-04-27 DIAGNOSIS — F32A Depression, unspecified: Secondary | ICD-10-CM

## 2022-04-27 DIAGNOSIS — E890 Postprocedural hypothyroidism: Secondary | ICD-10-CM

## 2022-04-28 MED ORDER — CITALOPRAM HYDROBROMIDE 20 MG PO TABS: 20.0000 mg | ORAL_TABLET | Freq: Every day | ORAL | 1 refills | Status: DC

## 2022-04-28 MED ORDER — LEVOTHYROXINE SODIUM 137 MCG PO TABS: 137.0000 ug | ORAL_TABLET | Freq: Every day | ORAL | 1 refills | Status: DC

## 2022-04-28 NOTE — Addendum Note (Signed)
Addended by: Marcelina Morel A on: 04/28/2022 09:29 AM   Modules accepted: Orders

## 2022-05-06 ENCOUNTER — Telehealth: Payer: Self-pay | Admitting: Cardiology

## 2022-05-06 NOTE — Telephone Encounter (Signed)
   Pre-operative Risk Assessment    Patient Name: Kristen Moran  DOB: October 05, 1956 MRN: [id number]     Request for Surgical Clearance    Procedure:   Braces  Date of Surgery:  Clearance 05/13/22                                 Surgeon:  Dr. Layla Barter Surgeon's Group or Practice Name:  [facility] Phone number:  [phone] Fax number:  [phone]   Type of Clearance Requested:   - Medical    Type of Anesthesia:  Not Indicated   Additional requests/questions:  Does this patient need antibiotics?  Signed, Bolivar Haw   05/06/2022, 3:08 PM

## 2022-05-07 NOTE — Telephone Encounter (Signed)
   Patient Name: Kristen Moran  DOB: 11-28-1955 MRN: [id number]  Primary Cardiologist: Gypsy Balsam, MD  Chart reviewed as part of pre-operative protocol coverage.   Simple dental extractions (i.e. 1-2 teeth)/procedures are considered low risk procedures per guidelines and generally do not require any specific cardiac clearance. It is also generally accepted that for simple extractions and dental cleanings, there is no need to interrupt blood thinner therapy.  SBE prophylaxis is not required for the patient from a cardiac standpoint.  I will route this recommendation to the requesting party via Epic fax function and remove from pre-op pool.  Please call with questions.  Joylene Grapes, NP 05/07/2022, 5:19 PM

## 2022-05-19 ENCOUNTER — Other Ambulatory Visit: Payer: Self-pay | Admitting: Cardiology

## 2022-05-23 ENCOUNTER — Other Ambulatory Visit: Payer: No Typology Code available for payment source

## 2022-05-23 DIAGNOSIS — E890 Postprocedural hypothyroidism: Secondary | ICD-10-CM

## 2022-05-24 LAB — TSH: TSH: 0.41 mIU/L (ref 0.40–4.50)

## 2022-07-15 ENCOUNTER — Other Ambulatory Visit: Payer: Self-pay | Admitting: Cardiology

## 2022-09-11 ENCOUNTER — Other Ambulatory Visit: Payer: Self-pay | Admitting: Nurse Practitioner

## 2022-09-11 DIAGNOSIS — I2694 Multiple subsegmental pulmonary emboli without acute cor pulmonale: Secondary | ICD-10-CM

## 2022-09-19 ENCOUNTER — Ambulatory Visit: Payer: No Typology Code available for payment source | Admitting: Cardiology

## 2022-10-13 IMAGING — CT CT HEART MORP W/ CTA COR W/ SCORE W/ CA W/CM &/OR W/O CM
4 of 7 series · 8 of 20 positions shown, 9 images · non-contrast
Comparison: 10/16/2014 chest CT from [REDACTED], report
only.
COMPARISON: 10/16/2014 chest CT from [REDACTED], report
only.

Addendum:
EXAM:
OVER-READ INTERPRETATION  CT CHEST

The following report is an over-read performed by radiologist Dr.
Zeyang Nie [REDACTED] on 12/06/2021. This over-read
does not include interpretation of cardiac or coronary anatomy or
pathology. The coronary CTA interpretation by the cardiologist is
attached.
CLINICAL DATA: Chest pain
Cardiac/Coronary CTA
TECHNIQUE: A non-contrast, gated CT scan was obtained with axial slices of 3 mm
through the heart for calcium scoring. Calcium scoring was performed
using the Agatston method. A 100 kV prospective, gated, contrast
cardiac scan was obtained. Gantry rotation speed was 250 msecs and
collimation was 0.6 mm. Two sublingual nitroglycerin tablets (0.8
mg) were given. The 3D data set was reconstructed in 5% intervals of
the 35-75% of the R-R cycle. Diastolic phases were analyzed on a
dedicated workstation using MPR, MIP, and VRT modes. The patient
received 95 cc of contrast.

[Series 6: ts diast sharp · axial · 0.42mm/px · z∈[-29,+7]mm · 2 of 271 slices shown]
[im 91/271  lung]
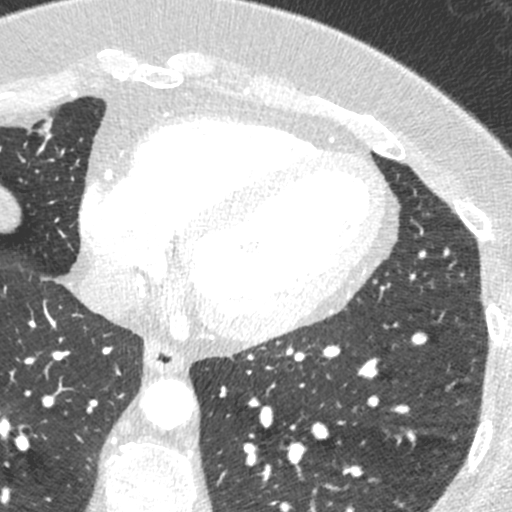
[im 181/271  lung]
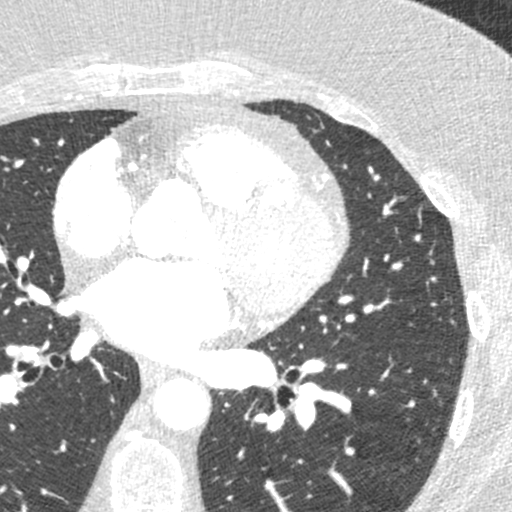

[Series 7: ts syst sharp · axial · 0.42mm/px · z∈[-29,+7]mm · 2 of 271 slices shown]
[im 91/271  lung]
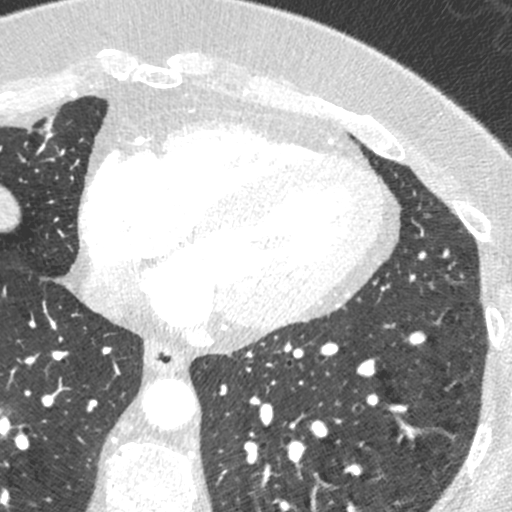
[im 181/271  lung]
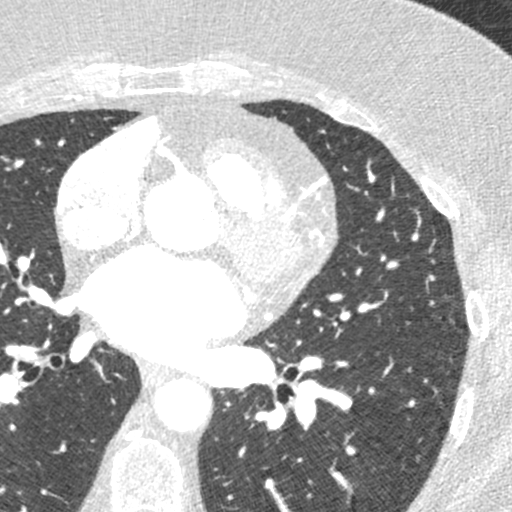

[Series 8: best diast · axial · 0.42mm/px · z∈[-29,+7]mm · 2 of 271 slices shown, 3 images]
[im 91/271  vessel]
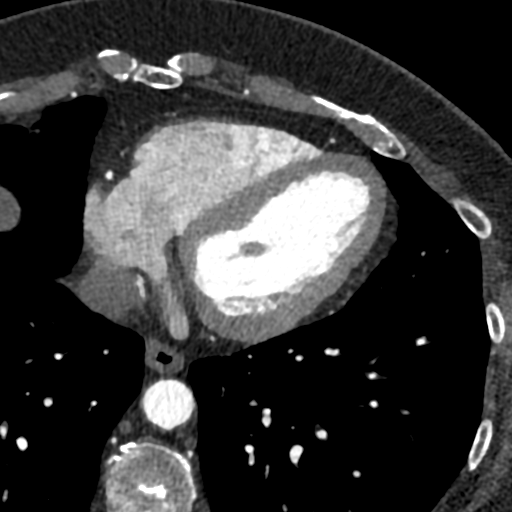
[im 91/271  lung]
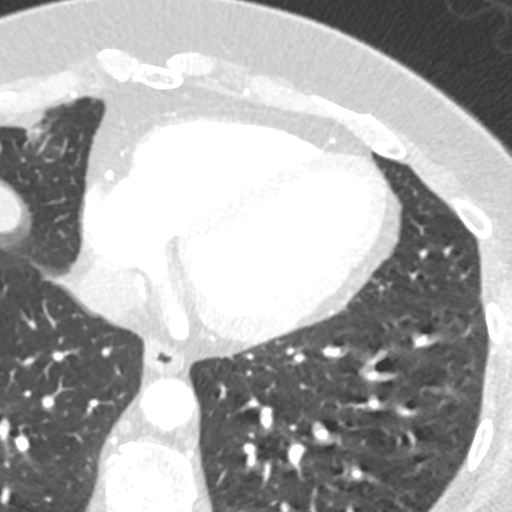
[im 181/271  vessel]
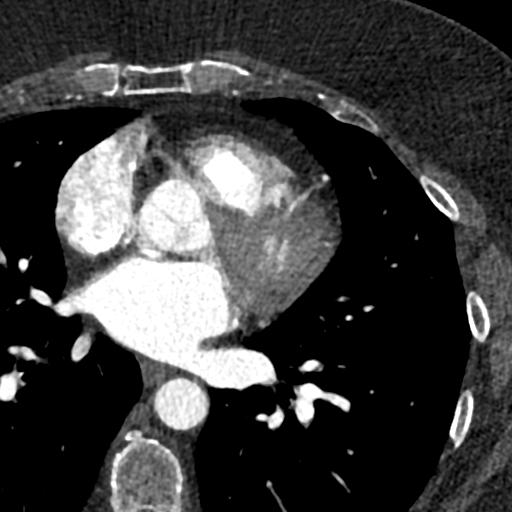

[Series 9: best syst · axial · 0.42mm/px · z∈[-29,+7]mm · 2 of 271 slices shown]
[im 91/271  vessel]
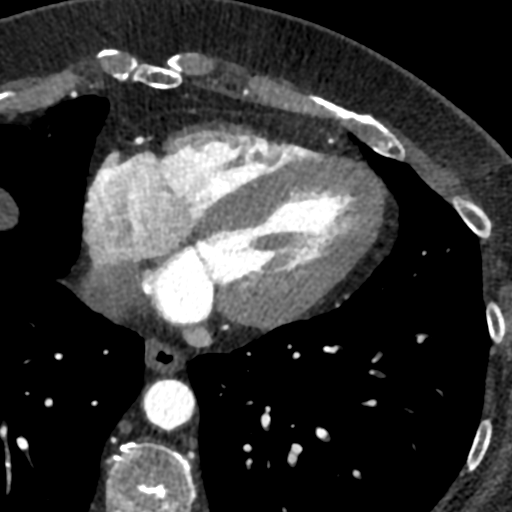
[im 181/271  vessel]
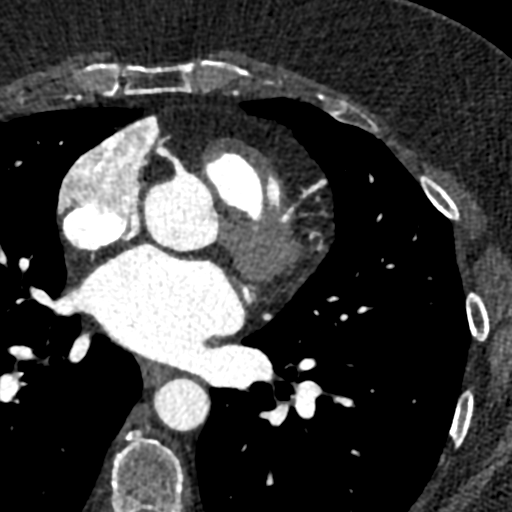

[8 of 20 positions shown; findings below may reference images not displayed]

FINDINGS: Vascular: Aortic atherosclerosis. Pulmonary artery enlargement,
cm.

Subtle subsegmental pulmonary emboli identified. Example right
middle lobe on images 18-20 of series 10. Left lower lobe images 24
through 28 of series 10 and coronal image 81/602. Also well
delineated on series 6.

Mediastinum/Nodes: No imaged thoracic adenopathy. Mildly dilated
lower esophagus with air-fluid level within on [DATE].

Lungs/Pleura: No pleural fluid. Right lower lobe subpleural
scarring.

Upper Abdomen: Hepatic steatosis.

Musculoskeletal: No acute osseous abnormality.
IMPRESSION: 1. Small volume bilateral pulmonary emboli, as detailed above.
2. Pulmonary artery enlargement suggests pulmonary arterial
hypertension.
3.  Aortic Atherosclerosis (UGWJA-GKC.C).
4. Hepatic steatosis
5. Esophageal air fluid level suggests dysmotility or
gastroesophageal reflux.

These results will be called to the ordering clinician or
representative by the Radiologist Assistant, and communication
documented in the PACS or [REDACTED].
FINDINGS: Image quality: Excellent.

Noise artifact is: Limited.

Coronary Arteries:  Normal coronary origin.  Right dominance.

Left main: The left main is a large caliber vessel with a normal
take off from the left coronary cusp that bifurcates to form a left
anterior descending artery and a left circumflex artery. There is no
plaque or stenosis.

Left anterior descending artery: There is minimal calcified plaque
(<25%) in the proximal LAD. The mid LAD contains minimal
non-calcified plaque (<25%). The distal LAD is patent. The first
diagonal contains minimal calcified plaque (<25%).

Left circumflex artery: The LCX is non-dominant. There is minimal
non-calcified plaque in the mid LCX (<25%). The LCX gives off 2
patent obtuse marginal branches.

Right coronary artery: The RCA is dominant with normal take off from
the right coronary cusp. There is no evidence of plaque or stenosis.
The RCA terminates as a PDA and right posterolateral branch without
evidence of plaque or stenosis.

Right Atrium: Right atrial size is within normal limits.

Right Ventricle: The right ventricular cavity is within normal
limits.

Left Atrium: Left atrial size is dilated with no left atrial
appendage filling defect.

Left Ventricle: The ventricular cavity size is within normal limits.
There are no stigmata of prior infarction. There is no abnormal
filling defect.

Pulmonary arteries: Dilated pulmonary artery suggestive of pulmonary
hypertension. No proximal filling defect.

Pulmonary veins: Normal pulmonary venous drainage.

Pericardium: Normal thickness with no significant effusion or
calcium present.

Cardiac valves: The aortic valve is trileaflet without significant
calcification. The mitral valve is normal structure without
significant calcification.

Aorta: Normal caliber with no significant disease.

Extra-cardiac findings: See attached radiology report for
non-cardiac structures.
IMPRESSION: 1. Coronary calcium score of 6.8. This was 60th percentile for age-,
sex, and race-matched controls.

2. Normal coronary origin with right dominance.

3. Minimal CAD (<25%) in the LAD/LCX.

4. Dilated pulmonary artery suggestive of pulmonary hypertension.

RECOMMENDATIONS:
1. Minimal non-obstructive CAD (0-24%). Consider non-atherosclerotic
causes of chest pain. Consider preventive therapy and risk factor
modification.

*** End of Addendum ***
EXAM:
OVER-READ INTERPRETATION  CT CHEST

The following report is an over-read performed by radiologist Dr.
Zeyang Nie [REDACTED] on 12/06/2021. This over-read
does not include interpretation of cardiac or coronary anatomy or
pathology. The coronary CTA interpretation by the cardiologist is
attached.
FINDINGS: Vascular: Aortic atherosclerosis. Pulmonary artery enlargement,
cm.

Subtle subsegmental pulmonary emboli identified. Example right
middle lobe on images 18-20 of series 10. Left lower lobe images 24
through 28 of series 10 and coronal image 81/602. Also well
delineated on series 6.

Mediastinum/Nodes: No imaged thoracic adenopathy. Mildly dilated
lower esophagus with air-fluid level within on [DATE].

Lungs/Pleura: No pleural fluid. Right lower lobe subpleural
scarring.

Upper Abdomen: Hepatic steatosis.

Musculoskeletal: No acute osseous abnormality.
IMPRESSION: 1. Small volume bilateral pulmonary emboli, as detailed above.
2. Pulmonary artery enlargement suggests pulmonary arterial
hypertension.
3.  Aortic Atherosclerosis (UGWJA-GKC.C).
4. Hepatic steatosis
5. Esophageal air fluid level suggests dysmotility or
gastroesophageal reflux.

These results will be called to the ordering clinician or
representative by the Radiologist Assistant, and communication
documented in the PACS or [REDACTED].

## 2022-10-23 LAB — HM MAMMOGRAPHY

## 2022-10-26 ENCOUNTER — Other Ambulatory Visit: Payer: Self-pay | Admitting: Nurse Practitioner

## 2022-10-26 DIAGNOSIS — F32A Depression, unspecified: Secondary | ICD-10-CM

## 2022-10-26 DIAGNOSIS — E890 Postprocedural hypothyroidism: Secondary | ICD-10-CM

## 2022-11-24 ENCOUNTER — Other Ambulatory Visit: Payer: Self-pay | Admitting: Cardiology

## 2023-01-23 ENCOUNTER — Other Ambulatory Visit: Payer: Self-pay | Admitting: Cardiology

## 2023-01-23 NOTE — Telephone Encounter (Signed)
Refill to pharmacy, patient needs appointment / 1st attempt

## 2023-01-26 ENCOUNTER — Other Ambulatory Visit: Payer: Self-pay | Admitting: Nurse Practitioner

## 2023-01-26 DIAGNOSIS — E890 Postprocedural hypothyroidism: Secondary | ICD-10-CM

## 2023-01-26 DIAGNOSIS — F32A Depression, unspecified: Secondary | ICD-10-CM

## 2023-03-30 ENCOUNTER — Encounter: Payer: No Typology Code available for payment source | Admitting: Nurse Practitioner

## 2023-04-17 ENCOUNTER — Ambulatory Visit: Payer: No Typology Code available for payment source | Admitting: Nurse Practitioner

## 2023-04-29 ENCOUNTER — Telehealth: Payer: Self-pay | Admitting: Cardiology

## 2023-04-29 DIAGNOSIS — I480 Paroxysmal atrial fibrillation: Secondary | ICD-10-CM

## 2023-04-29 DIAGNOSIS — I2694 Multiple subsegmental pulmonary emboli without acute cor pulmonale: Secondary | ICD-10-CM

## 2023-04-29 MED ORDER — APIXABAN 5 MG PO TABS: 5.0000 mg | ORAL_TABLET | Freq: Two times a day (BID) | ORAL | 0 refills | Status: DC

## 2023-04-29 NOTE — Telephone Encounter (Addendum)
Prescription refill request for Eliquis received. Indication: afib  Last office visit: Kristen Moran, 04/18/2022 Scr: 0.93, 04/04/2022 Age: 67 yo Weight: 113.2 kg   Refill sent.   Pt due to see Dr. [name] on 07/01/2023. Put on appt note that pt needs cbc and bmet. Orders placed.

## 2023-04-29 NOTE — Telephone Encounter (Signed)
*  STAT* If patient is at the pharmacy, call can be transferred to refill team.   1. Which medications need to be refilled? (please list name of each medication and dose if known) ELIQUIS 5 MG TABS tablet   2. Which pharmacy/location (including street and city if local pharmacy) is medication to be sent to? [id number] - [place], [place] - [address] AT [facility] [address]    3. Do they need a 30 day or 90 day supply? 90 day

## 2023-05-19 ENCOUNTER — Other Ambulatory Visit: Payer: Self-pay | Admitting: Cardiology

## 2023-05-19 ENCOUNTER — Other Ambulatory Visit: Payer: Self-pay | Admitting: Nurse Practitioner

## 2023-05-19 DIAGNOSIS — F32A Depression, unspecified: Secondary | ICD-10-CM

## 2023-05-22 ENCOUNTER — Encounter: Payer: No Typology Code available for payment source | Admitting: Nurse Practitioner

## 2023-05-27 DIAGNOSIS — F331 Major depressive disorder, recurrent, moderate: Secondary | ICD-10-CM | POA: Diagnosis not present

## 2023-05-27 DIAGNOSIS — F5101 Primary insomnia: Secondary | ICD-10-CM | POA: Diagnosis not present

## 2023-05-27 DIAGNOSIS — Z6841 Body Mass Index (BMI) 40.0 and over, adult: Secondary | ICD-10-CM | POA: Diagnosis not present

## 2023-05-27 DIAGNOSIS — R7303 Prediabetes: Secondary | ICD-10-CM | POA: Diagnosis not present

## 2023-05-27 DIAGNOSIS — E559 Vitamin D deficiency, unspecified: Secondary | ICD-10-CM | POA: Diagnosis not present

## 2023-05-27 DIAGNOSIS — Z7901 Long term (current) use of anticoagulants: Secondary | ICD-10-CM | POA: Diagnosis not present

## 2023-05-27 DIAGNOSIS — E890 Postprocedural hypothyroidism: Secondary | ICD-10-CM | POA: Diagnosis not present

## 2023-05-27 DIAGNOSIS — Z86711 Personal history of pulmonary embolism: Secondary | ICD-10-CM | POA: Diagnosis not present

## 2023-05-27 DIAGNOSIS — G4733 Obstructive sleep apnea (adult) (pediatric): Secondary | ICD-10-CM | POA: Diagnosis not present

## 2023-05-27 DIAGNOSIS — I480 Paroxysmal atrial fibrillation: Secondary | ICD-10-CM | POA: Diagnosis not present

## 2023-05-27 DIAGNOSIS — E785 Hyperlipidemia, unspecified: Secondary | ICD-10-CM | POA: Diagnosis not present

## 2023-06-24 DIAGNOSIS — L578 Other skin changes due to chronic exposure to nonionizing radiation: Secondary | ICD-10-CM | POA: Diagnosis not present

## 2023-06-24 DIAGNOSIS — L570 Actinic keratosis: Secondary | ICD-10-CM | POA: Diagnosis not present

## 2023-06-24 DIAGNOSIS — L821 Other seborrheic keratosis: Secondary | ICD-10-CM | POA: Diagnosis not present

## 2023-06-24 DIAGNOSIS — L738 Other specified follicular disorders: Secondary | ICD-10-CM | POA: Diagnosis not present

## 2023-06-24 DIAGNOSIS — L814 Other melanin hyperpigmentation: Secondary | ICD-10-CM | POA: Diagnosis not present

## 2023-06-25 NOTE — Telephone Encounter (Signed)
Erroneous encounter

## 2023-06-26 DIAGNOSIS — I480 Paroxysmal atrial fibrillation: Secondary | ICD-10-CM | POA: Diagnosis not present

## 2023-06-30 ENCOUNTER — Telehealth: Payer: No Typology Code available for payment source

## 2023-06-30 NOTE — Telephone Encounter (Signed)
[facility] called on behalf of the patient requesting a refill on Crestor to be sent to [facility].  Patient has not had a lipid panel or seen x 1 year or greater. Outgoing call placed to patient to verify PCP status, no answer, left voicemial.  Awaiting return call

## 2023-07-01 ENCOUNTER — Encounter: Payer: Self-pay | Admitting: Cardiology

## 2023-07-01 ENCOUNTER — Ambulatory Visit: Payer: No Typology Code available for payment source | Attending: Cardiology | Admitting: Cardiology

## 2023-07-01 VITALS — BP 130/80 | HR 60 | Ht 63.0 in | Wt 246.0 lb

## 2023-07-01 DIAGNOSIS — R7303 Prediabetes: Secondary | ICD-10-CM

## 2023-07-01 DIAGNOSIS — Z6841 Body Mass Index (BMI) 40.0 and over, adult: Secondary | ICD-10-CM | POA: Diagnosis not present

## 2023-07-01 DIAGNOSIS — R0683 Snoring: Secondary | ICD-10-CM

## 2023-07-01 DIAGNOSIS — E785 Hyperlipidemia, unspecified: Secondary | ICD-10-CM

## 2023-07-01 DIAGNOSIS — R0609 Other forms of dyspnea: Secondary | ICD-10-CM

## 2023-07-01 DIAGNOSIS — I480 Paroxysmal atrial fibrillation: Secondary | ICD-10-CM | POA: Diagnosis not present

## 2023-07-01 DIAGNOSIS — Z86711 Personal history of pulmonary embolism: Secondary | ICD-10-CM | POA: Diagnosis not present

## 2023-07-01 NOTE — Addendum Note (Signed)
Addended by: Baldo Ash D on: 07/01/2023 03:09 PM   Modules accepted: Orders

## 2023-07-01 NOTE — Progress Notes (Signed)
Cardiology Office Note:    Date:  07/01/2023   ID:  Wilva, Vonbehren 11-11-1955, MRN [id number]  PCP:  Sharon Seller, NP  Cardiologist:  Gypsy Balsam, MD    Referring MD: Sharon Seller, NP   Chief Complaint  Patient presents with   Shortness of Breath   Cough   Fatigue    All ongoing for 1 year  H/o PE    History of Present Illness:    Kristen Moran is a 67 y.o. female with past medical history significant for pulmonary emboli she is being anticoagulated, essential hypertension, dyslipidemia, paroxysmal atrial fibrillation.  Comes today to months for follow-up.  She complained of having cough cough happening especially evening time there is no maneuver that she can do to alleviate the cough and balance her somewhat.  She described also fatigue shortness of breath she understand that she is obese and this will be beneficial for her to lose some weight however she still would like to have this problem investigated.  She does have history of PE February 2023 she is being anticoagulated since that time.  She is try to be more active but have difficulty doing it because of fatigue tiredness shortness of breath  Past Medical History:  Diagnosis Date   Hyperlipidemia    Hypertension    Thyroid disease    Hyperthyroidism    Past Surgical History:  Procedure Laterality Date   APPENDECTOMY  11/2017   CESAREAN SECTION  1990    Antony Blackbird MD   CESAREAN SECTION  1993   [place] MD   CHOLECYSTECTOMY  1996   Clent Ridges, MD   EYELID LACERATION REPAIR  08/2014    Current Medications: Current Meds  Medication Sig   apixaban (ELIQUIS) 5 MG TABS tablet Take 1 tablet (5 mg total) by mouth 2 (two) times daily.   citalopram (CELEXA) 20 MG tablet TAKE 1 TABLET(20 MG) BY MOUTH DAILY (Patient taking differently: Take 20 mg by mouth daily.)   diltiazem (CARDIZEM CD) 120 MG 24 hr capsule Take 1 capsule (120 mg total) by mouth daily. Patient must keep appointment on 07/01/23  for further refills. 2 nd attempt   levothyroxine (SYNTHROID) 137 MCG tablet TAKE 1 TABLET(137 MCG) BY MOUTH DAILY (Patient taking differently: Take 137 mcg by mouth daily before breakfast.)   metoprolol tartrate (LOPRESSOR) 25 MG tablet Take 1 tablet (25 mg total) by mouth 2 (two) times daily.   rosuvastatin (CRESTOR) 5 MG tablet Take 1 tablet (5 mg total) by mouth daily.     Allergies:   Lipitor [atorvastatin]   Social History   Socioeconomic History   Marital status: Divorced    Spouse name: Not on file   Number of children: Not on file   Years of education: Not on file   Highest education level: Not on file  Occupational History   Not on file  Tobacco Use   Smoking status: Never   Smokeless tobacco: Never  Vaping Use   Vaping status: Never Used  Substance and Sexual Activity   Alcohol use: Yes    Comment: occasion   Drug use: No   Sexual activity: Not Currently  Other Topics Concern   Not on file  Social History Narrative   Not on file   Social Determinants of Health   Financial Resource Strain: Not on file  Food Insecurity: Not on file  Transportation Needs: Not on file  Physical Activity: Not on file  Stress: Not on file  Social Connections: Unknown (03/03/2022)   Received from [facility]   Social Network    Social Network: Not on file     Family History: The patient's family history includes Cancer in her maternal grandfather, maternal grandmother, and mother; Colitis in her mother; Diabetes in her father; Osteoporosis in her mother. ROS:   Please see the history of present illness.    All 14 point review of systems negative except as described per history of present illness  EKGs/Labs/Other Studies Reviewed:    EKG Interpretation Date/Time:  Wednesday July 01 2023 14:18:59 EDT Ventricular Rate:  60 PR Interval:  168 QRS Duration:  84 QT Interval:  432 QTC Calculation: 432 R Axis:   -9  Text Interpretation: Sinus rhythm with Premature  supraventricular complexes Cannot rule out Anterior infarct , age undetermined When compared with ECG of 06-Dec-2021 18:02, PREVIOUS ECG IS PRESENT Confirmed by Gypsy Balsam [phone]) on 07/01/2023 2:41:32 PM    Recent Labs: 06/26/2023: BUN 14; Creatinine, Ser 1.09; Hemoglobin 13.2; Platelets 217; Potassium 4.6; Sodium 138  Recent Lipid Panel    Component Value Date/Time   CHOL 149 02/21/2022 0917   TRIG 138 02/21/2022 0917   HDL 83 02/21/2022 0917   CHOLHDL 1.8 02/21/2022 0917   CHOLHDL 2.9 10/04/2021 1152   VLDL 23 05/15/2017 0854   LDLCALC 43 02/21/2022 0917   LDLCALC 111 (H) 10/04/2021 1152    Physical Exam:    VS:  BP 130/80 (BP Location: Left Arm, Patient Position: Sitting)   Pulse 60   Ht 5\' 3"  (1.6 m)   Wt 246 lb (111.6 kg)   SpO2 94%   BMI 43.58 kg/m     Wt Readings from Last 3 Encounters:  07/01/23 246 lb (111.6 kg)  04/18/22 249 lb 9.6 oz (113.2 kg)  04/04/22 249 lb 6.4 oz (113.1 kg)     GEN:  Well nourished, well developed in no acute distress HEENT: Normal NECK: No JVD; No carotid bruits LYMPHATICS: No lymphadenopathy CARDIAC: RRR, no murmurs, no rubs, no gallops RESPIRATORY:  Clear to auscultation without rales, wheezing or rhonchi  ABDOMEN: Soft, non-tender, non-distended MUSCULOSKELETAL:  No edema; No deformity  SKIN: Warm and dry LOWER EXTREMITIES: no swelling NEUROLOGIC:  Alert and oriented x 3 PSYCHIATRIC:  Normal affect   ASSESSMENT:    1. Paroxysmal atrial fibrillation (HCC)   2. Dyslipidemia   3. History of pulmonary embolism   4. Prediabetes   5. Class 3 severe obesity due to excess calories with serious comorbidity and body mass index (BMI) of 40.0 to 44.9 in adult [facility])    PLAN:    In order of problems listed above:  Paroxysmal atrial fibrillation denies having any frequent episodes rare episode of skipped beats but no sustained arrhythmia.  Anticoagulated which I will continue.  Rhythm control strategy but no antiarrhythmic  yet. Dyslipidemia I did review K PN which show me her LDL 43 HDL 83 good cholesterol control continue present management. Dyspnea on exertion multifactorial obesity and inactivity play some role here however I will schedule her to have echocardiogram to make sure she does not have any cardiomyopathy also will look for pulmonary pressure. Obstructive sleep apnea which is severe.  She could not tolerate mask and she is asking me if she would be candidate for inspire we will refer her to our team. Obesity diffuse problem I think she can benefit from medication like Reginal Lutes she is talking about it with her primary care physician about that  Medication Adjustments/Labs and Tests Ordered: Current medicines are reviewed at length with the patient today.  Concerns regarding medicines are outlined above.  Orders Placed This Encounter  Procedures   EKG 12-Lead   Medication changes: No orders of the defined types were placed in this encounter.   Signed, Georgeanna Lea, MD, [facility] 07/01/2023 2:52 PM    [facility]

## 2023-07-01 NOTE — Patient Instructions (Addendum)
Medication Instructions:  Your physician recommends that you continue on your current medications as directed. Please refer to the Current Medication list given to you today.  *If you need a refill on your cardiac medications before your next appointment, please call your pharmacy*   Lab Work: None Ordered If you have labs (blood work) drawn today and your tests are completely normal, you will receive your results only by: MyChart Message (if you have MyChart) OR A paper copy in the mail If you have any lab test that is abnormal or we need to change your treatment, we will call you to review the results.   Testing/Procedures: Your physician has requested that you have an echocardiogram. Echocardiography is a painless test that uses sound waves to create images of your heart. It provides your doctor with information about the size and shape of your heart and how well your heart's chambers and valves are working. This procedure takes approximately one hour. There are no restrictions for this procedure. Please do NOT wear cologne, perfume, aftershave, or lotions (deodorant is allowed). Please arrive 15 minutes prior to your appointment time.    Follow-Up: At [facility], you and your health needs are our priority.  As part of our continuing mission to provide you with exceptional heart care, we have created designated Provider Care Teams.  These Care Teams include your primary Cardiologist (physician) and Advanced Practice Providers (APPs -  Physician Assistants and Nurse Practitioners) who all work together to provide you with the care you need, when you need it.  We recommend signing up for the patient portal called "MyChart".  Sign up information is provided on this After Visit Summary.  MyChart is used to connect with patients for Virtual Visits (Telemedicine).  Patients are able to view lab/test results, encounter notes, upcoming appointments, etc.  Non-urgent messages can be sent to your  provider as well.   To learn more about what you can do with MyChart, go to [email or web address].    Your next appointment:   6 month(s)  The format for your next appointment:   In Person  Provider:   Gypsy Balsam, MD    Other Instructions Referral to Dr. Mayford Knife- They will call for appt

## 2023-07-02 NOTE — Telephone Encounter (Signed)
[facility] called again to check the status of refill request and I explained that we are waiting for a return call from patient to verify PCP. Mark verbalized understanding.  Another outgoing call placed to patient and she confirmed that she is no longer a patient of Jessica's as she moved to another city.

## 2023-07-03 ENCOUNTER — Telehealth: Payer: Self-pay

## 2023-07-03 NOTE — Telephone Encounter (Signed)
Left message on My Chart with normal lab results per Dr. Vanetta Shawl note

## 2023-07-21 ENCOUNTER — Other Ambulatory Visit: Payer: Self-pay | Admitting: Nurse Practitioner

## 2023-07-21 DIAGNOSIS — F32A Depression, unspecified: Secondary | ICD-10-CM

## 2023-07-26 ENCOUNTER — Other Ambulatory Visit: Payer: Self-pay | Admitting: Cardiology

## 2023-07-26 DIAGNOSIS — I2694 Multiple subsegmental pulmonary emboli without acute cor pulmonale: Secondary | ICD-10-CM

## 2023-07-27 ENCOUNTER — Ambulatory Visit (HOSPITAL_BASED_OUTPATIENT_CLINIC_OR_DEPARTMENT_OTHER)
Admission: RE | Admit: 2023-07-27 | Discharge: 2023-07-27 | Disposition: A | Discharge status: Home / Self Care | Payer: No Typology Code available for payment source | Source: Ambulatory Visit | Attending: Cardiology | Admitting: Cardiology

## 2023-07-27 ENCOUNTER — Encounter: Payer: Self-pay | Admitting: Cardiology

## 2023-07-27 DIAGNOSIS — R0609 Other forms of dyspnea: Secondary | ICD-10-CM | POA: Insufficient documentation

## 2023-07-27 LAB — ECHOCARDIOGRAM COMPLETE
AR max vel: 2.78 cm2
AV Area VTI: 2.63 cm2
AV Area mean vel: 2.66 cm2
AV Mean grad: 5.5 mm[Hg]
AV Peak grad: 7.9 mm[Hg]
Ao pk vel: 1.41 m/s
Area-P 1/2: 3.93 cm2
Calc EF: 77.4 %
S' Lateral: 2.4 cm
Single Plane A2C EF: 79.5 %
Single Plane A4C EF: 75.1 %

## 2023-07-27 NOTE — Telephone Encounter (Signed)
Prescription refill request for Eliquis received. Indication:afib Last office visit:9/24 Scr:1.09  9/24 Age: 67 Weight:111.6  kg  Prescription refilled

## 2023-08-03 ENCOUNTER — Telehealth: Payer: Self-pay

## 2023-08-03 NOTE — Telephone Encounter (Signed)
Left message on My Chart with normal results per Dr. Vanetta Shawl note. Routed to PCP.

## 2023-10-05 ENCOUNTER — Other Ambulatory Visit: Payer: Self-pay | Admitting: Cardiology

## 2023-10-23 ENCOUNTER — Other Ambulatory Visit: Payer: Self-pay | Admitting: Nurse Practitioner

## 2023-10-23 DIAGNOSIS — E890 Postprocedural hypothyroidism: Secondary | ICD-10-CM

## 2023-11-06 ENCOUNTER — Encounter: Payer: Self-pay | Admitting: Cardiology

## 2023-11-06 ENCOUNTER — Ambulatory Visit: Payer: Medicare (Managed Care) | Attending: Cardiology | Admitting: Cardiology

## 2023-11-06 ENCOUNTER — Ambulatory Visit: Payer: Medicare (Managed Care) | Admitting: Cardiology

## 2023-11-06 VITALS — BP 126/72 | HR 68 | Ht 63.0 in | Wt 240.0 lb

## 2023-11-06 DIAGNOSIS — Z6841 Body Mass Index (BMI) 40.0 and over, adult: Secondary | ICD-10-CM

## 2023-11-06 DIAGNOSIS — I10 Essential (primary) hypertension: Secondary | ICD-10-CM

## 2023-11-06 DIAGNOSIS — G4733 Obstructive sleep apnea (adult) (pediatric): Secondary | ICD-10-CM

## 2023-11-06 DIAGNOSIS — E66813 Obesity, class 3: Secondary | ICD-10-CM

## 2023-11-06 NOTE — Patient Instructions (Addendum)
Medication Instructions:  Your physician recommends that you continue on your current medications as directed. Please refer to the Current Medication list given to you today.  *If you need a refill on your cardiac medications before your next appointment, please call your pharmacy*  Lab Work: None ordered today.  Testing/Procedures: Your physician has recommended that you have a home sleep study. This test records several body functions during sleep, including: brain activity, eye movement, oxygen and carbon dioxide blood levels, heart rate and rhythm, breathing rate and rhythm, the flow of air through your mouth and nose, snoring, body muscle movements, and chest and belly movement.   Follow-Up: At [facility], you and your health needs are our priority.  As part of our continuing mission to provide you with exceptional heart care, we have created designated Provider Care Teams.  These Care Teams include your primary Cardiologist (physician) and Advanced Practice Providers (APPs -  Physician Assistants and Nurse Practitioners) who all work together to provide you with the care you need, when you need it.  Your next appointment:   As needed  The format for your next appointment:   In Person  Provider:   Armanda Magic, MD {  Other Instructions You have been referred to our Pharmacist Laural Golden to discuss weight loss options. A member from our scheduling team will call you to schedule an appointment.  You have been referred to [facility] at [facility]. A member from our scheduling team will call you to schedule an appointment.

## 2023-11-06 NOTE — Progress Notes (Signed)
Sleep Medicine CONSULT Note    Date:  11/06/2023   ID:  Kristen Moran, Kristen Moran 10-Jan-1956, MRN [id number]  PCP:  Georgeanna Lea, MD  Cardiologist: Gypsy Balsam, MD   Chief Complaint  Patient presents with   New Patient (Initial Visit)    OSA    History of Present Illness:  Kristen Moran is a 68 y.o. female who is being seen today for the evaluation of obstructive sleep apnea at the request of Gypsy Balsam, MD.  This is a 68 year old female with a history of hypertension, hyperlipidemia, paroxysmal atrial fibrillation and obstructive sleep apnea.  She recently saw Dr. Bing Matter and told him that she was not tolerating her CPAP mask and was interested in seeing if she was a candidate for the inspire device.  She had her initial sleep study in 10/09/2019 which showed severe obstructive sleep apnea with an AHI of 33.7/h and a central index of 0.  She has been on CPAP since then but really is not tolerating it.  She has tried 3 different masks including the FFM, nasal mask and nasal cushion mask.  She has problems with clasutrophobia and has not been able to tolerate.  Past Medical History:  Diagnosis Date   Hyperlipidemia    Hypertension    OSA (obstructive sleep apnea) 2020   severe obstructive sleep apnea with an AHI of 33.7/h   Thyroid disease    Hyperthyroidism    Past Surgical History:  Procedure Laterality Date   APPENDECTOMY  11/2017   CESAREAN SECTION  1990    Antony Blackbird MD   CESAREAN SECTION  1993   [place] MD   CHOLECYSTECTOMY  1996   Clent Ridges, MD   EYELID LACERATION REPAIR  08/2014    Current Medications: Current Meds  Medication Sig   apixaban (ELIQUIS) 5 MG TABS tablet TAKE 1 TABLET(5 MG) BY MOUTH TWICE DAILY   citalopram (CELEXA) 20 MG tablet TAKE 1 TABLET(20 MG) BY MOUTH DAILY (Patient taking differently: Take 20 mg by mouth daily.)   diltiazem (CARDIZEM CD) 120 MG 24 hr capsule TAKE 1 CAPSULE(120 MG) BY MOUTH DAILY   levothyroxine  (SYNTHROID) 137 MCG tablet TAKE 1 TABLET(137 MCG) BY MOUTH DAILY (Patient taking differently: Take 137 mcg by mouth daily before breakfast.)   metoprolol tartrate (LOPRESSOR) 25 MG tablet Take 1 tablet (25 mg total) by mouth 2 (two) times daily.   rosuvastatin (CRESTOR) 5 MG tablet Take 1 tablet (5 mg total) by mouth daily.    Allergies:   Lipitor [atorvastatin]   Social History   Socioeconomic History   Marital status: Divorced    Spouse name: Not on file   Number of children: Not on file   Years of education: Not on file   Highest education level: Not on file  Occupational History   Not on file  Tobacco Use   Smoking status: Never   Smokeless tobacco: Never  Vaping Use   Vaping status: Never Used  Substance and Sexual Activity   Alcohol use: Yes    Comment: occasion   Drug use: No   Sexual activity: Not Currently  Other Topics Concern   Not on file  Social History Narrative   Not on file   Social Drivers of Health   Financial Resource Strain: Not on file  Food Insecurity: Not on file  Transportation Needs: Not on file  Physical Activity: Not on file  Stress: Not on file  Social Connections: Unknown (03/03/2022)  Received from [facility]   Social Network    Social Network: Not on file     Family History:  The patient's family history includes Cancer in her maternal grandfather, maternal grandmother, and mother; Colitis in her mother; Diabetes in her father; Osteoporosis in her mother.   ROS:   Please see the history of present illness.    ROS All other systems reviewed and are negative.      No data to display             PHYSICAL EXAM:   VS:  BP 126/72   Pulse 68   Ht 5\' 3"  (1.6 m)   Wt 240 lb (108.9 kg)   BMI 42.51 kg/m    GEN: Well nourished, well developed in no acute distress HEENT: Normal NECK: No JVD; No carotid bruits LYMPHATICS: No lymphadenopathy CARDIAC:RRR, no murmurs, rubs, gallops RESPIRATORY:  Clear to  auscultation without rales, wheezing or rhonchi  ABDOMEN: Soft, non-tender, non-distended MUSCULOSKELETAL:  No edema; No deformity  SKIN: Warm and dry NEUROLOGIC:  Alert and oriented x 3 PSYCHIATRIC:  Normal affect  Wt Readings from Last 3 Encounters:  11/06/23 240 lb (108.9 kg)  07/01/23 246 lb (111.6 kg)  04/18/22 249 lb 9.6 oz (113.2 kg)      Studies/Labs Reviewed:   Home sleep study 2020  Recent Labs: 06/26/2023: BUN 14; Creatinine, Ser 1.09; Hemoglobin 13.2; Platelets 217; Potassium 4.6; Sodium 138     ASSESSMENT:    1. OSA (obstructive sleep apnea)   2. Benign essential HTN       PLAN:  In order of problems listed above:  OSA  Morbid Obesity -Patient has a history of severe obstructive sleep apnea with an AHI of 33/h on sleep study in 2020.  She has been on CPAP since then but is really not tolerating it and wishes to investigate the inspire device. -We discussed how the inspire device works -Given that her sleep study is over 13 years old I told her we would need to start out with a repeat home sleep study and see how severe her OSA is.  SHe is willing to try CPAP again until she can lose the weight for Inspire.  I would recommend a 2 week mask desensitization prior to placing her on CPAP -unfortunately she is not a candidate for the Inspire device currently as her BMI is too high (45 and needs to be < 36) -encouraged her to lose weight to get to a BMI of 36 or less -will refer to healthy weight and wellness at [facility] and also our Pharm D for eval for weight loss injections  Hypertension -BP controlled on exam today -Continue prescription drug management Cardizem CD 120 mg daily and Lopressor 25 mg twice daily with.  Refills  Time Spent: 20 minutes total time of encounter, including 15 minutes spent in face-to-face patient care on the date of this encounter. This time includes coordination of care and counseling regarding above mentioned problem list. Remainder of  non-face-to-face time involved reviewing chart documents/testing relevant to the patient encounter and documentation in the medical record. I have independently reviewed documentation from referring provider  Medication Adjustments/Labs and Tests Ordered: Current medicines are reviewed at length with the patient today.  Concerns regarding medicines are outlined above.  Medication changes, Labs and Tests ordered today are listed in the Patient Instructions below.  There are no Patient Instructions on file for this visit.   Signed, Armanda Magic, MD  11/06/2023 1:42 PM    [facility] [address] [place], [place], [place]  [address] Phone: [phone]; Fax: [phone]

## 2023-11-06 NOTE — Addendum Note (Signed)
Addended by: Franchot Gallo on: 11/06/2023 01:59 PM   Modules accepted: Orders

## 2023-11-06 NOTE — Progress Notes (Unsigned)
 This encounter was created in error - please disregard.

## 2023-11-10 ENCOUNTER — Telehealth: Payer: Self-pay | Admitting: Pharmacy Technician

## 2023-11-10 ENCOUNTER — Ambulatory Visit: Payer: Medicare (Managed Care)

## 2023-11-10 ENCOUNTER — Other Ambulatory Visit (HOSPITAL_COMMUNITY): Payer: Self-pay

## 2023-11-10 ENCOUNTER — Telehealth: Payer: Self-pay | Admitting: Pharmacist

## 2023-11-10 NOTE — Progress Notes (Deleted)
Patient ID: [name]                 DOB: [birth year]                    MRN: [id number]     HPI: Kristen Moran is a 68 y.o. female patient referred to pharmacy clinic by Dr.Turner  to initiate GLP1-RA therapy. PMH is significant for hypertension, hyperlipidemia, paroxysmal atrial fibrillation and obstructive sleep apnea , and obesity. Most recent BMI 42.51 and weight 240 lbs.  Baseline weight and BMI: 42.51 and 240 lbs Current weight and BMI: 42.51 and 240 lbs Current meds that affect weight: none    *** Follow-up visit  Assess % weight loss Assess adverse effects Missed doses  Diet:   Exercise:   Family History:   Social History:   Labs: Lab Results  Component Value Date   HGBA1C 5.7 (H) 04/04/2022    Wt Readings from Last 1 Encounters:  11/06/23 240 lb (108.9 kg)    BP Readings from Last 1 Encounters:  11/06/23 126/72   Pulse Readings from Last 1 Encounters:  11/06/23 68       Component Value Date/Time   CHOL 149 02/21/2022 0917   TRIG 138 02/21/2022 0917   HDL 83 02/21/2022 0917   CHOLHDL 1.8 02/21/2022 0917   CHOLHDL 2.9 10/04/2021 1152   VLDL 23 05/15/2017 0854   LDLCALC 43 02/21/2022 0917   LDLCALC 111 (H) 10/04/2021 1152    Past Medical History:  Diagnosis Date   Hyperlipidemia    Hypertension    OSA (obstructive sleep apnea) 2020   severe obstructive sleep apnea with an AHI of 33.7/h   Thyroid disease    Hyperthyroidism    Current Outpatient Medications on File Prior to Visit  Medication Sig Dispense Refill   apixaban (ELIQUIS) 5 MG TABS tablet TAKE 1 TABLET(5 MG) BY MOUTH TWICE DAILY 180 tablet 1   citalopram (CELEXA) 20 MG tablet TAKE 1 TABLET(20 MG) BY MOUTH DAILY (Patient taking differently: Take 20 mg by mouth daily.) 3 tablet 0   diltiazem (CARDIZEM CD) 120 MG 24 hr capsule TAKE 1 CAPSULE(120 MG) BY MOUTH DAILY 90 capsule 2   levothyroxine (SYNTHROID) 137 MCG tablet TAKE 1 TABLET(137 MCG) BY MOUTH DAILY (Patient  taking differently: Take 137 mcg by mouth daily before breakfast.) 15 tablet 0   metoprolol tartrate (LOPRESSOR) 25 MG tablet Take 1 tablet (25 mg total) by mouth 2 (two) times daily. 180 tablet 0   rosuvastatin (CRESTOR) 5 MG tablet Take 1 tablet (5 mg total) by mouth daily. 90 tablet 3   No current facility-administered medications on file prior to visit.    Allergies  Allergen Reactions   Lipitor [Atorvastatin] Other (See Comments)    myalgias      Assessment/Plan:  1. Weight loss - Patient has not met goal of at least 5% of body weight loss with comprehensive lifestyle modifications alone in the past 3-6 months. Pharmacotherapy is appropriate to pursue as augmentation. Will start ***. Confirmed patient not ***pregnant and no personal or family history of medullary thyroid carcinoma (MTC) or Multiple Endocrine Neoplasia syndrome type 2 (MEN 2). Injection technique reviewed at today's visit.  Advised patient on common side effects including nausea, diarrhea, dyspepsia, decreased appetite, and fatigue. Counseled patient on reducing meal size and how to titrate medication to minimize side effects. Counseled patient to call if intolerable side effects or if experiencing dehydration, abdominal pain, or dizziness.  Patient will adhere to dietary modifications and will target at least 150 minutes of moderate intensity exercise weekly.   Follow up in 1 month via telephone for tolerability update and dose titration.

## 2023-11-10 NOTE — Telephone Encounter (Signed)
Pharmacy Patient Advocate Encounter  Received notification from [employer] that Prior Authorization for zepbound has been APPROVED from 10/21/23 to 11/09/24. Ran test claim, Copay is $317.06 one month. This test claim was processed through [facility]- copay amounts may vary at other pharmacies due to pharmacy/plan contracts, or as the patient moves through the different stages of their insurance plan.   PA #/Case ID/Reference #: [id number]

## 2023-11-10 NOTE — Telephone Encounter (Signed)
Pharmacy Patient Advocate Encounter   Received notification from Pt Calls Messages that prior authorization for zepbound is required/requested.   Insurance verification completed.   The patient is insured through [employer] .   Per test claim: PA required; PA submitted to above mentioned insurance via CoverMyMeds Key/confirmation #/EOC [id number] Status is pending

## 2023-11-12 NOTE — Telephone Encounter (Signed)
Had appt on Jan 21 for GLP1. Pt ended up cancelling due to lack of transportation. Call to reschedule. N/A

## 2023-11-24 NOTE — Telephone Encounter (Signed)
 Had appt on Jan 21 for GLP1. Pt ended up cancelling due to lack of transportation. Call to reschedule. N/A

## 2024-01-04 ENCOUNTER — Other Ambulatory Visit: Payer: Self-pay | Admitting: Cardiology

## 2024-01-25 ENCOUNTER — Telehealth: Payer: Self-pay

## 2024-01-25 NOTE — Telephone Encounter (Signed)
**Note De-Identified Millissa Deese Obfuscation** Per Mena at [facility], a [place] is not required for CPT Code: G0399-Home Sleep Study.  I have transferred the order to the sleep lab so they can contact the pt to schedule test.

## 2024-02-29 ENCOUNTER — Ambulatory Visit (HOSPITAL_BASED_OUTPATIENT_CLINIC_OR_DEPARTMENT_OTHER): Payer: Medicare (Managed Care) | Attending: Cardiology | Admitting: Cardiology

## 2024-02-29 DIAGNOSIS — G4733 Obstructive sleep apnea (adult) (pediatric): Secondary | ICD-10-CM | POA: Diagnosis present

## 2024-02-29 DIAGNOSIS — G4736 Sleep related hypoventilation in conditions classified elsewhere: Secondary | ICD-10-CM | POA: Diagnosis not present

## 2024-03-02 NOTE — Procedures (Signed)
   Maryan Smalling [facility] [address] [place], [place] [address] Tel: [phone]   Fax: [phone]  Home Sleep Test Interpretation  Patient Name: Kristen Moran, Kristen Moran Date: 02/29/2024  Date of Birth: Jun 25, 1956 Study Type: HST  Age: 68 year MRN #: [id number]  Sex: Female Interpreting Physician: Gaylyn Keas [id number]  Height: 5\' 3"  Referring Physician: Edrie Gower  Weight: 240.0 lbs Recording Tech: Alvah Auerbach RRT RPSGT RST  BMI: 42.5 Scoring Tech: Holly Neeriemer RPSGT RST  ESS: 10 Neck Size: 14.5   Indications for Polysomnography The patient is a 68 year-old Female who is 5\' 3"  and weighs 240.0 lbs. Her BMI equals 42.5.  A home sleep apnea test was performed to evaluate for Obstructive Sleep Apnea.  Medication  No Data.   Polysomnogram Data A home sleep test recorded the standard physiologic parameters including EKG, nasal and oral airflow.  Respiratory parameters of chest and abdominal movements were recorded with Respiratory Inductance Plethysmography belts.  Oxygen saturation was recorded by pulse oximetry.   Study Architecture The total recording time of the polysomnogram was 400.1 minutes.  The total monitoring time was 400.5 minutes.  Time spent in Supine position was 221.5 minutes.   Respiratory Events The study revealed a presence of 161 obstructive, 0 central, and 3 mixed apneas resulting in an Apnea index of 24.6 events per hour.  There were 112 hypopneas (>=3% desaturation and/or arousal) resulting in an Apnea\Hypopnea Index (AHI >=3% desaturation and/or arousal) of 41.3 events per hour.  There were 93 hypopneas (>=4% desaturation) resulting in an Apnea\Hypopnea Index (AHI >=4% desaturation) of 38.5 events per hour.  There were 0 Respiratory Effort Related Arousals resulting in a RERA index of 0 events per hour. The Respiratory Disturbance Index is 41.3 events per hour.  The snore index was 48.8 events per hour.  Mean oxygen  saturation was 93.1%.  The lowest oxygen saturation during monitoring time was 80.0%.  Time spent <=88% oxygen saturation was 28.5 minutes (7.1%).  Cardiac Summary The average pulse rate was 51.4 bpm.  The minimum pulse rate was 47.0 bpm while the maximum pulse rate was 69.0 bpm.    Diagnosis:  Severe Obstructive Sleep Apnea Nocturnal Hypoxemia  Recommendations: Recommend in lab CPAP titration for treatment of sleep disordered breathing. The patient should be counseled in good sleep hygiene and avoid sleeping in the supine position. Encourage the patient to avoid driving when sleepy.   This study was personally reviewed and electronically signed by: Edrie Gower Accredited Board Certified in Sleep Medicine Date/Time: 03/02/2024 9:35PM

## 2024-03-11 ENCOUNTER — Ambulatory Visit: Payer: Medicare (Managed Care) | Admitting: Cardiology

## 2024-03-29 ENCOUNTER — Telehealth: Payer: Self-pay

## 2024-03-29 DIAGNOSIS — R0683 Snoring: Secondary | ICD-10-CM

## 2024-03-29 DIAGNOSIS — G4733 Obstructive sleep apnea (adult) (pediatric): Secondary | ICD-10-CM

## 2024-03-29 NOTE — Telephone Encounter (Signed)
 Left callback number for patient to receive sleep study results and recommendations.

## 2024-03-29 NOTE — Addendum Note (Signed)
 Addended by: Joslyn Nim on: 03/29/2024 10:59 AM   Modules accepted: Orders

## 2024-03-29 NOTE — Telephone Encounter (Signed)
 The patient has been notified of the result and verbalized understanding.  All questions (if any) were answered. Gaylene Kays, CMA 03/29/2024 10:00 AM    PRECERT TITRATION

## 2024-03-29 NOTE — Telephone Encounter (Signed)
-----   Message from Gaylyn Keas sent at 03/02/2024  9:37 PM EDT ----- Please let patient know that they have sleep apnea.  Recommend therapeutic CPAP titration for treatment of patient's sleep disordered breathing.

## 2024-05-02 NOTE — Telephone Encounter (Signed)
 Prior Authorization for TITRATION sent to [employer] via web portal. Tracking Number . service does not require Prior Authorization from [employer] at this time. Patient is scheduled for 06/06/24.

## 2024-05-16 ENCOUNTER — Ambulatory Visit: Payer: Medicare (Managed Care) | Admitting: Cardiology

## 2024-06-01 ENCOUNTER — Ambulatory Visit: Payer: Medicare (Managed Care) | Admitting: Cardiology

## 2024-06-06 ENCOUNTER — Ambulatory Visit (HOSPITAL_BASED_OUTPATIENT_CLINIC_OR_DEPARTMENT_OTHER): Payer: Medicare (Managed Care) | Attending: Cardiology | Admitting: Cardiology

## 2024-06-06 DIAGNOSIS — G4733 Obstructive sleep apnea (adult) (pediatric): Secondary | ICD-10-CM | POA: Insufficient documentation

## 2024-06-06 DIAGNOSIS — R0683 Snoring: Secondary | ICD-10-CM

## 2024-06-08 NOTE — Procedures (Signed)
  Indications for Polysomnography The patient is a 68 year old Female who is 5' 3 and weighs 230.0 lbs. Her BMI equals 40.2.  A full night titration treatment study was performed.  Patient reported taking her medications at 8:30 pm.CrestorDiltiazemEliquis Polysomnogram Data A full night polysomnogram recorded the standard physiologic parameters including EEG, EOG, EMG, EKG, nasal and oral airflow.  Respiratory parameters of chest and abdominal movements were recorded with Respiratory Inductance Plethysmography belts.   Oxygen saturation was recorded by pulse oximetry.  Sleep Architecture The total recording time of the polysomnogram was 415.1 minutes.  The total sleep time was 344.0 minutes.  The patient spent 11.9% of total sleep time in Stage N1, 66.0% in Stage N2, 0.3% in Stages N3, and 21.8% in REM.  Sleep latency was 33.1 minutes.   REM latency was 185.5 minutes.  Sleep Efficiency was 82.9%.  Wake after Sleep Onset time was 38.0 minutes.  Titration Summary The patient was titrated at pressures ranging from 6 cm/H20 up to 24/20cm/H20. The last pressure used in the study was 24/20cm/H20.  Respiratory Events The polysomnogram revealed a presence of 0 obstructive, 3 centrals, and 0 mixed apneas resulting in an Apnea index of 0.5 events per hour.  There were 77 hypopneas (GreaterEqual to3% desaturation and/or arousal) resulting in an Apnea\Hypopnea Index (AHI  GreaterEqual to3% desaturation and/or arousal) of 14.0 events per hour.  There were 43 hypopneas (GreaterEqual to4% desaturation) resulting in an Apnea\Hypopnea Index (AHI GreaterEqual to4% desaturation) of 8.0 events per hour.  There were 40 Respiratory  Effort Related Arousals resulting in a RERA index of 7.0 events per hour. The Respiratory Disturbance Index is 20.9 events per hour.  The snore index was 0 events per hour.  Mean oxygen saturation was 93.4%.  The lowest oxygen saturation during sleep was 83.0%.  Time spent LessEqual to88%  oxygen saturation was  minutes ().  Limb Activity There were 0 limb movements recorded.  Cardiac Summary The average pulse rate was 52.7 bpm.  The minimum pulse rate was 40.0 bpm while the maximum pulse rate was 72.0 bpm.  Cardiac rhythm was normal with PACs  Diagnosis: Obstructive Sleep Apnea  Recommendations: 1. Recommend a trial of CPAP at 18cm H2O with heated humidity and medium ResMed F20 mask. 2. Close follow-up is necessary to ensure success with CPAP or oral appliance therapy for maximum benefit. 3. A follow-up oximetry study on CPAP is recommended to assess the adequacy of therapy and determine the need for supplemental oxygen or the potential need for Bi-level therapy.  An arterial blood gas to determine the adequacy of baseline ventilation and  oxygenation should also be considered. 4. Healthy sleep recommendations include:  adequate nightly sleep (normal 7-9 hrs/night), avoidance of caffeine after noon and alcohol near bedtime, and maintaining a sleep environment that is cool, dark and quiet. 5. Weight loss for overweight patients is recommended.  Even modest amounts of weight loss can significantly improve the severity of sleep apnea. 6. Snoring recommendations include:  weight loss where appropriate, side sleeping, and avoidance of alcohol before bed. 7. Operation of motor vehicle should be avoided when sleepy.    This study was personally reviewed and electronically signed by: Dr. Wilbert Bihari Accredited Board Certified in Sleep Medicine Date/Time: 06/08/2024 11:17AM

## 2024-06-16 ENCOUNTER — Other Ambulatory Visit: Payer: Self-pay | Admitting: Nurse Practitioner

## 2024-06-16 DIAGNOSIS — F32A Depression, unspecified: Secondary | ICD-10-CM

## 2024-06-23 ENCOUNTER — Telehealth: Payer: Self-pay | Admitting: *Deleted

## 2024-06-23 NOTE — Telephone Encounter (Signed)
The patient has been notified of the result. Left detailed message on voicemail and informed patient to call back.

## 2024-06-23 NOTE — Telephone Encounter (Signed)
-----   Message from Wilbert Bihari sent at 06/08/2024 11:17 AM EDT ----- Please let patient know that they had a successful PAP titration and let DME know that orders are in EPIC.  Please set up 6 week OV with me.

## 2024-07-08 ENCOUNTER — Telehealth: Payer: Self-pay | Admitting: Cardiology

## 2024-07-08 NOTE — Telephone Encounter (Signed)
 Pt returning Brad Molt call regarding sleep study results.

## 2024-07-08 NOTE — Telephone Encounter (Signed)
 Returned Call: The patient has been notified of the result and verbalized understanding.  All questions (if any) were answered. Kristen Moran, CMA 07/08/2024 11:48 AM    Upon patient request DME selection is [facility] Patient understands he will be contacted by [facility] to set up his cpap. Patient understands to call if [facility] does not contact him with new setup in a timely manner. Patient understands they will be called once confirmation has been received from [facility] that they have received their new machine to schedule 10 week follow up appointment.   [facility] notified of new cpap order  Please add to airview Patient was grateful for the call and thanked me.

## 2024-07-12 NOTE — Telephone Encounter (Signed)
 Patient understands they will be called once confirmation has been received from [facility] that they have received their new machine to schedule 10 week follow up appointment.   [facility] notified of new cpap order  Please add to airview Patient was grateful for the call and thanked me.

## 2024-08-04 ENCOUNTER — Ambulatory Visit: Payer: Medicare (Managed Care) | Attending: Cardiology | Admitting: Cardiology
